# Patient Record
Sex: Female | Born: 1965 | Race: White | Hispanic: No | Marital: Single | Smoking: Current every day smoker
Health system: Southern US, Community
[De-identification: ages and names within clinical notes are randomized; demographics above are authoritative.]

## PROBLEM LIST (undated history)

## (undated) DIAGNOSIS — D649 Anemia, unspecified: Secondary | ICD-10-CM

## (undated) DIAGNOSIS — M5430 Sciatica, unspecified side: Secondary | ICD-10-CM

## (undated) DIAGNOSIS — J449 Chronic obstructive pulmonary disease, unspecified: Secondary | ICD-10-CM

## (undated) DIAGNOSIS — K219 Gastro-esophageal reflux disease without esophagitis: Secondary | ICD-10-CM

## (undated) DIAGNOSIS — R091 Pleurisy: Secondary | ICD-10-CM

## (undated) DIAGNOSIS — I10 Essential (primary) hypertension: Secondary | ICD-10-CM

## (undated) HISTORY — PX: ABLATION: SHX5711

## (undated) HISTORY — PX: OTHER SURGICAL HISTORY: SHX169

## (undated) HISTORY — PX: TUBAL LIGATION: SHX77

## (undated) SURGERY — COLONOSCOPY
Anesthesia: Moderate Sedation

---

## 2006-05-27 ENCOUNTER — Ambulatory Visit (HOSPITAL_COMMUNITY): Admission: RE | Admit: 2006-05-27 | Discharge: 2006-05-27 | Payer: Self-pay | Admitting: General Surgery

## 2006-05-27 ENCOUNTER — Encounter (INDEPENDENT_AMBULATORY_CARE_PROVIDER_SITE_OTHER): Payer: Self-pay | Admitting: Specialist

## 2008-02-13 ENCOUNTER — Emergency Department (HOSPITAL_COMMUNITY): Admission: EM | Admit: 2008-02-13 | Discharge: 2008-02-13 | Payer: Self-pay | Admitting: Emergency Medicine

## 2008-03-15 ENCOUNTER — Emergency Department (HOSPITAL_COMMUNITY): Admission: EM | Admit: 2008-03-15 | Discharge: 2008-03-15 | Payer: Self-pay | Admitting: Emergency Medicine

## 2008-07-30 ENCOUNTER — Emergency Department (HOSPITAL_COMMUNITY): Admission: EM | Admit: 2008-07-30 | Discharge: 2008-07-30 | Payer: Self-pay | Admitting: Emergency Medicine

## 2009-01-20 ENCOUNTER — Emergency Department (HOSPITAL_COMMUNITY): Admission: EM | Admit: 2009-01-20 | Discharge: 2009-01-20 | Payer: Self-pay | Admitting: Emergency Medicine

## 2010-04-29 LAB — DIFFERENTIAL
Basophils Absolute: 0.1 K/uL (ref 0.0–0.1)
Basophils Relative: 1 % (ref 0–1)
Eosinophils Absolute: 0.3 K/uL (ref 0.0–0.7)
Eosinophils Relative: 5 % (ref 0–5)
Lymphocytes Relative: 47 % — ABNORMAL HIGH (ref 12–46)
Lymphs Abs: 3.2 K/uL (ref 0.7–4.0)
Monocytes Absolute: 0.3 K/uL (ref 0.1–1.0)
Monocytes Relative: 5 % (ref 3–12)
Neutro Abs: 2.9 K/uL (ref 1.7–7.7)
Neutrophils Relative %: 42 % — ABNORMAL LOW (ref 43–77)

## 2010-04-29 LAB — CBC
HCT: 24.8 % — ABNORMAL LOW (ref 36.0–46.0)
Hemoglobin: 7.6 g/dL — ABNORMAL LOW (ref 12.0–15.0)
MCHC: 30.4 g/dL (ref 30.0–36.0)
MCV: 58.5 fL — ABNORMAL LOW (ref 78.0–100.0)
RBC: 4.25 MIL/uL (ref 3.87–5.11)
WBC: 6.8 10*3/uL (ref 4.0–10.5)

## 2010-04-29 LAB — URINALYSIS, ROUTINE W REFLEX MICROSCOPIC
Bilirubin Urine: NEGATIVE
Glucose, UA: NEGATIVE mg/dL
Hgb urine dipstick: NEGATIVE
Ketones, ur: NEGATIVE mg/dL
Nitrite: NEGATIVE
Protein, ur: NEGATIVE mg/dL
Specific Gravity, Urine: <1.005 — ABNORMAL LOW (ref 1.005–1.030)
Urobilinogen, UA: 0.2 mg/dL (ref 0.0–1.0)
pH: 6.5 (ref 5.0–8.0)

## 2010-04-29 LAB — BASIC METABOLIC PANEL
CO2: 25 mEq/L (ref 19–32)
Chloride: 108 mEq/L (ref 96–112)
Glucose, Bld: 108 mg/dL — ABNORMAL HIGH (ref 70–99)
Potassium: 3.1 mEq/L — ABNORMAL LOW (ref 3.5–5.1)
Sodium: 140 mEq/L (ref 135–145)

## 2010-04-29 LAB — POCT CARDIAC MARKERS
CKMB, poc: <1 ng/mL — ABNORMAL LOW (ref 1.0–8.0)
Myoglobin, poc: 45.9 ng/mL (ref 12–200)
Troponin i, poc: <0.05 ng/mL (ref 0.00–0.09)

## 2010-04-29 LAB — PREGNANCY, URINE

## 2010-04-29 LAB — D-DIMER, QUANTITATIVE

## 2010-06-14 NOTE — H&P (Signed)
NAMEKEVIN, [name]             ACCOUNT NO.:  [id number]   MEDICAL RECORD NO.:  [id number]          PATIENT TYPE:  AMB   LOCATION:                                FACILITY:  [facility]   PHYSICIAN:  Dalia Heading, M.D.  DATE OF BIRTH:  Nov 25, 1965   DATE OF ADMISSION:  DATE OF DISCHARGE:  LH                              HISTORY & PHYSICAL   CHIEF COMPLAINT:  Mass, face.   HISTORY OF PRESENT ILLNESS:  Patient is a 45 year old white female who  was referred for evaluation and treatment of a enlarging mass on her  face. It has been present for many years. No drainage has been noted.   PAST MEDICAL HISTORY:  Unremarkable.   PAST SURGICAL HISTORY:  Unremarkable.   CURRENT MEDICATIONS:  None.   ALLERGIES:  NO KNOWN DRUG ALLERGIES.   REVIEW OF SYSTEMS:  Patient denies any cardiopulmonary difficulties.   SOCIAL HISTORY:  She smokes a pack of cigarettes a day. She denies any  alcohol use.   PHYSICAL EXAMINATION:  GENERAL:  The patient is a well-developed, well-  nourished white female in no acute distress.  HEENT:  Examination reveals a 3 centimeter subcutaneous oval mass  present over the right cheek superiorly. It is mobile.  LUNGS:  Clear to auscultation with equal breath sounds bilaterally.  HEART:  Reveals a regular rate and rhythm without S3, S4 murmurs.   IMPRESSION:  Neoplasm, face.   PLAN:  The patient is scheduled for excision of the neoplasm, face on  05/27/2006. The risks and benefits of the procedure including bleeding,  infection, the possibility of scar revision in the future was fully  explained to the patient, gave informed consent. Short stay day  __________      Dalia Heading, M.D.  Electronically Signed     MAJ/MEDQ  D:  05/19/2006  T:  05/19/2006  Job:  [id number]   cc:   Dalia Heading, M.D.  Fax: [phone]

## 2010-06-14 NOTE — Op Note (Signed)
[name], [name]             ACCOUNT NO.:  [id number]   MEDICAL RECORD NO.:  [id number]          PATIENT TYPE:  AMB   LOCATION:  DAY                           FACILITY:  [facility]   PHYSICIAN:  Dalia Heading, M.D.  DATE OF BIRTH:  May 22, 1965   DATE OF PROCEDURE:  05/27/2006  DATE OF DISCHARGE:                               OPERATIVE REPORT   PREOPERATIVE DIAGNOSIS:  Mass, face.   POSTOPERATIVE DIAGNOSIS:  Sebaceous cyst, face.   PROCEDURE:  Excision of sebaceous cyst, face.   SURGEON:  Franky Macho, M.D.   ANESTHESIA:  General.   INDICATIONS:  The patient is a 45 year old white female who has a  subcutaneous mass which has been enlarging over the right cheek.  It has  been present for many years and the patient would like to have it  removed.  The risks and benefits of the procedure including bleeding,  infection, nerve injury, and the possibly of needing scar revision in  the future were fully explained to the patient, gave informed consent.   PROCEDURE NOTE:  The patient was placed in the supine position.  After  general anesthesia was administered, the right side of the face was  prepped and draped using the usual sterile technique with Betadine.  Surgical site confirmation was performed.   An orange-peel incision was made over the mass which measured  approximately 3-4 cm in its greatest diameter.  The mass was removed  without difficulty.  It is appeared to be a sebaceous cyst.  Excess skin  was then trimmed in order to facilitate closure due to significant  stretching of the skin from the mass.  The subcutaneous layer was  reapproximated using 5-0 Vicryl interrupted sutures.  The skin was  closed using a 5-0 Vicryl subcuticular suture.  Dermabond was then  applied.   All tape and needle counts were correct at the end of the procedure.  The patient was awakened and transferred to PACU in stable condition.   COMPLICATIONS:  None.   SPECIMEN:  Sebaceous cyst,  face.   BLOOD LOSS:  Minimal.      Dalia Heading, M.D.  Electronically Signed     MAJ/MEDQ  D:  05/27/2006  T:  05/27/2006  Job:  [id number]

## 2010-11-06 ENCOUNTER — Inpatient Hospital Stay (HOSPITAL_COMMUNITY)
Admission: EM | Admit: 2010-11-06 | Discharge: 2010-11-08 | DRG: 812 | Discharge status: Against Medical Advice | Payer: Self-pay | Attending: Internal Medicine | Admitting: Internal Medicine

## 2010-11-06 ENCOUNTER — Emergency Department (HOSPITAL_COMMUNITY): Payer: Self-pay

## 2010-11-06 ENCOUNTER — Encounter: Payer: Self-pay | Admitting: *Deleted

## 2010-11-06 DIAGNOSIS — D509 Iron deficiency anemia, unspecified: Principal | ICD-10-CM | POA: Diagnosis present

## 2010-11-06 DIAGNOSIS — N920 Excessive and frequent menstruation with regular cycle: Secondary | ICD-10-CM | POA: Diagnosis present

## 2010-11-06 DIAGNOSIS — E876 Hypokalemia: Secondary | ICD-10-CM | POA: Diagnosis present

## 2010-11-06 DIAGNOSIS — N939 Abnormal uterine and vaginal bleeding, unspecified: Secondary | ICD-10-CM | POA: Diagnosis present

## 2010-11-06 DIAGNOSIS — E611 Iron deficiency: Secondary | ICD-10-CM | POA: Diagnosis present

## 2010-11-06 DIAGNOSIS — E538 Deficiency of other specified B group vitamins: Secondary | ICD-10-CM | POA: Diagnosis not present

## 2010-11-06 DIAGNOSIS — R109 Unspecified abdominal pain: Secondary | ICD-10-CM

## 2010-11-06 DIAGNOSIS — Z720 Tobacco use: Secondary | ICD-10-CM | POA: Diagnosis present

## 2010-11-06 DIAGNOSIS — F17200 Nicotine dependence, unspecified, uncomplicated: Secondary | ICD-10-CM | POA: Diagnosis present

## 2010-11-06 DIAGNOSIS — I10 Essential (primary) hypertension: Secondary | ICD-10-CM | POA: Diagnosis present

## 2010-11-06 DIAGNOSIS — Z800 Family history of malignant neoplasm of digestive organs: Secondary | ICD-10-CM

## 2010-11-06 DIAGNOSIS — D529 Folate deficiency anemia, unspecified: Secondary | ICD-10-CM | POA: Diagnosis present

## 2010-11-06 DIAGNOSIS — D649 Anemia, unspecified: Secondary | ICD-10-CM

## 2010-11-06 DIAGNOSIS — K449 Diaphragmatic hernia without obstruction or gangrene: Secondary | ICD-10-CM | POA: Diagnosis present

## 2010-11-06 DIAGNOSIS — K921 Melena: Secondary | ICD-10-CM | POA: Diagnosis present

## 2010-11-06 HISTORY — DX: Chronic obstructive pulmonary disease, unspecified: J44.9

## 2010-11-06 LAB — COMPREHENSIVE METABOLIC PANEL
ALT: 6 U/L (ref 0–35)
AST: 12 U/L (ref 0–37)
Albumin: 3.5 g/dL (ref 3.5–5.2)
Alkaline Phosphatase: 52 U/L (ref 39–117)
Calcium: 8.6 mg/dL (ref 8.4–10.5)
GFR calc Af Amer: >90 mL/min (ref >90)
Potassium: 3.3 mEq/L — ABNORMAL LOW (ref 3.5–5.1)
Sodium: 138 mEq/L (ref 135–145)
Total Protein: 6.6 g/dL (ref 6.0–8.3)

## 2010-11-06 LAB — DIFFERENTIAL
Basophils Absolute: 0.1 10*3/uL (ref 0.0–0.1)
Eosinophils Absolute: 0.1 10*3/uL (ref 0.0–0.7)
Lymphs Abs: 2.8 10*3/uL (ref 0.7–4.0)
Monocytes Absolute: 0.5 10*3/uL (ref 0.1–1.0)
Neutrophils Relative %: 47 % (ref 43–77)

## 2010-11-06 LAB — CBC
MCH: 15.1 pg — ABNORMAL LOW (ref 26.0–34.0)
MCV: 56.5 fL — ABNORMAL LOW (ref 78.0–100.0)
Platelets: 178 10*3/uL (ref 150–400)
RBC: 3.52 MIL/uL — ABNORMAL LOW (ref 3.87–5.11)
RDW: 20.2 % — ABNORMAL HIGH (ref 11.5–15.5)

## 2010-11-06 LAB — URINALYSIS, ROUTINE W REFLEX MICROSCOPIC
Glucose, UA: NEGATIVE mg/dL
Specific Gravity, Urine: <1.005 — ABNORMAL LOW (ref 1.005–1.030)
Urobilinogen, UA: 0.2 mg/dL (ref 0.0–1.0)
pH: 5.5 (ref 5.0–8.0)

## 2010-11-06 LAB — URINE MICROSCOPIC-ADD ON

## 2010-11-06 LAB — OCCULT BLOOD, POC DEVICE: Fecal Occult Bld: NEGATIVE

## 2010-11-06 LAB — PREGNANCY, URINE: Preg Test, Ur: NEGATIVE

## 2010-11-06 MED ORDER — IOHEXOL 300 MG/ML  SOLN
100.0000 mL | Freq: Once | INTRAMUSCULAR | Status: AC | PRN
  Administered 2010-11-06: 100 mL via INTRAVENOUS

## 2010-11-06 MED ORDER — IPRATROPIUM BROMIDE 0.02 % IN SOLN: 0.5000 mg | Freq: Four times a day (QID) | RESPIRATORY_TRACT | Status: DC | PRN

## 2010-11-06 MED ORDER — PANTOPRAZOLE SODIUM 40 MG PO TBEC
40.0000 mg | DELAYED_RELEASE_TABLET | Freq: Two times a day (BID) | ORAL | Status: DC
  Administered 2010-11-07: 40 mg via ORAL
  Filled 2010-11-06: qty 1

## 2010-11-06 MED ORDER — NICOTINE 14 MG/24HR TD PT24
14.0000 mg | MEDICATED_PATCH | Freq: Every day | TRANSDERMAL | Status: DC
  Administered 2010-11-07: 14 mg via TRANSDERMAL
  Filled 2010-11-06: qty 1

## 2010-11-06 MED ORDER — SODIUM CHLORIDE 0.9 % IJ SOLN: 3.0000 mL | INTRAMUSCULAR | Status: DC | PRN

## 2010-11-06 MED ORDER — ACETAMINOPHEN 650 MG RE SUPP: 650.0000 mg | Freq: Four times a day (QID) | RECTAL | Status: DC | PRN

## 2010-11-06 MED ORDER — SODIUM CHLORIDE 0.9 % IV SOLN: 250.0000 mL | INTRAVENOUS | Status: DC

## 2010-11-06 MED ORDER — ONDANSETRON HCL 4 MG/2ML IJ SOLN
4.0000 mg | Freq: Once | INTRAMUSCULAR | Status: AC
  Administered 2010-11-06: 4 mg via INTRAVENOUS
  Filled 2010-11-06: qty 2

## 2010-11-06 MED ORDER — MORPHINE SULFATE 2 MG/ML IJ SOLN: 2.0000 mg | INTRAMUSCULAR | Status: DC | PRN

## 2010-11-06 MED ORDER — ALBUTEROL SULFATE (5 MG/ML) 0.5% IN NEBU: 2.5000 mg | INHALATION_SOLUTION | Freq: Four times a day (QID) | RESPIRATORY_TRACT | Status: DC | PRN

## 2010-11-06 MED ORDER — ACETAMINOPHEN 325 MG PO TABS
650.0000 mg | ORAL_TABLET | Freq: Once | ORAL | Status: DC
  Filled 2010-11-06: qty 2

## 2010-11-06 MED ORDER — DIPHENHYDRAMINE HCL 25 MG PO CAPS
25.0000 mg | ORAL_CAPSULE | Freq: Once | ORAL | Status: AC
  Administered 2010-11-07: 25 mg via ORAL
  Filled 2010-11-06: qty 1

## 2010-11-06 MED ORDER — SODIUM CHLORIDE 0.9 % IJ SOLN: 3.0000 mL | Freq: Two times a day (BID) | INTRAMUSCULAR | Status: DC

## 2010-11-06 MED ORDER — HYDROCODONE-ACETAMINOPHEN 5-325 MG PO TABS: 1.0000 | ORAL_TABLET | ORAL | Status: DC | PRN

## 2010-11-06 MED ORDER — PANTOPRAZOLE SODIUM 40 MG IV SOLR
INTRAVENOUS | Status: AC
  Filled 2010-11-06: qty 80

## 2010-11-06 MED ORDER — SODIUM CHLORIDE 0.9 % IV BOLUS (SEPSIS): 1000.0000 mL | Freq: Once | INTRAVENOUS | Status: DC

## 2010-11-06 MED ORDER — SODIUM CHLORIDE 0.9 % IV SOLN
80.0000 mg | Freq: Once | INTRAVENOUS | Status: AC
  Administered 2010-11-06: 80 mg via INTRAVENOUS
  Filled 2010-11-06: qty 80

## 2010-11-06 MED ORDER — ACETAMINOPHEN 500 MG PO TABS
500.0000 mg | ORAL_TABLET | Freq: Four times a day (QID) | ORAL | Status: DC | PRN
  Administered 2010-11-07: 500 mg via ORAL
  Filled 2010-11-06: qty 1

## 2010-11-06 MED ORDER — HYDROMORPHONE HCL 1 MG/ML IJ SOLN
1.0000 mg | Freq: Once | INTRAMUSCULAR | Status: AC
  Administered 2010-11-06: 1 mg via INTRAVENOUS
  Filled 2010-11-06: qty 1

## 2010-11-06 NOTE — H&P (Signed)
PCP:   No primary provider on file.  Chief Complaint:  Abdominal pain  HPI: This is a 44 y/o female with no significant past medical history, who today developed severe right sided abdominal pain, which woke here from sleep. She states it was sharp and intermittent. Pain continued to worsen during the course of the day. She finally present to the ER. She reports no nausea, no vomiting, no diarrhea. No blood in her urine, no burning on urination. No fevers, no chills.   In the ER blood work revealed a hemoglobin of 5. The patient states she has been having dark stools on and off for approximately 6 months. She does not report using spicy food, no NSAIDs. No h/o PUD. No significant alcohol use. She has never had a colonoscopy. She does have a pretty significant family history of colon cancer. He deceased father had colon cancer in his 63's, he brother aged 72 was recently diagnosed with colon cancer. She reports good appetite, no weight loss. She does report menorrhagia.  Review of Systems: (positives bolded) The patient denies anorexia, fever, weight loss,, vision loss, decreased hearing, hoarseness, chest pain, syncope, dyspnea on exertion, peripheral edema, balance deficits, hemoptysis, abdominal pain, melena, hematochezia, severe indigestion/heartburn, hematuria, incontinence, genital sores, muscle weakness, suspicious skin lesions, transient blindness, difficulty walking, depression, unusual weight change, abnormal bleeding, enlarged lymph nodes, angioedema, and breast masses.  Past Medical History: History reviewed. No pertinent past medical history. History reviewed. No pertinent past surgical history.  Medications: Prior to Admission medications   Medication Sig Start Date End Date Taking? Authorizing Provider  aspirin-acetaminophen-caffeine (HEADACHE RELIEF) 250-250-65 MG per tablet Take 2 tablets by mouth as needed. For headaches    Yes Historical Provider, MD    Oreg-Peppermint-Thyme-Gldnseal (YEAST FORMULA PO) Take 1 tablet by mouth 3 (three) times daily as needed. For yeast infection    Yes Historical Provider, MD    Allergies:  No Known Allergies  Social History:  reports that she has been smoking Cigarettes.  She has been smoking about 1 pack per day. She does not have any smokeless tobacco history on file. She reports that she does not drink alcohol or use illicit drugs.  Family History:  colon cancer  Physical Exam: Filed Vitals:   11/06/10 1805 11/06/10 2021 11/06/10 2151  BP: 153/102 134/86 122/72  Pulse: 102 80 61  Temp: 98.8 F (37.1 C)    TempSrc: Oral    Resp: 20 20 18   Height: 5\' 3"  (1.6 m)    Weight: 63.504 kg (140 lb)    SpO2: 100% 100% 97%   General:  Alert and oriented times three, well developed and nourished, no acute distress Eyes: PERRLA, pink conjunctiva, scleral icterus ENT: Moist oral mucosa, neck supple, no thyromegaly Lungs: clear to ascultation, no wheeze, no crackles, no use of accessory muscles Cardiovascular: regular rate and rhythm, no regurgitation, no gallops, no murmurs. No carotid bruits, no JVD Abdomen: soft, positive BS, non-tender, non distended, no organomegaly, not an acute abdomen GU: not examined Neuro: CN II - XII grossly intact, sensation intact Musculoskeletal: strength 5/5 all extremities, no clubbing, cyanosis or edema Skin: no rash, no subcutaneous crepitation, no decubitus Psych: appropriate patient   Labs on Admission:   [facility] 11/06/10 1925  NA 138  K 3.3*  CL 106  CO2 24  GLUCOSE 100*  BUN 6  CREATININE 0.57  CALCIUM 8.6  MG --  PHOS --    Basename 11/06/10 1925  AST 12  ALT 6  ALKPHOS 52  BILITOT 0.2*  PROT 6.6  ALBUMIN 3.5    Basename 11/06/10 1925  LIPASE 28  AMYLASE --    Basename 11/06/10 1925  WBC 6.5  NEUTROABS 3.0  HGB 5.3*  HCT 19.9*  MCV 56.5*  PLT 178   No results found for this basename: CKTOTAL:3,CKMB:3,CKMBINDEX:3,TROPONINI:3 in  the last 72 hours No results found for this basename: TSH,T4TOTAL,FREET3,T3FREE,THYROIDAB in the last 72 hours No results found for this basename: VITAMINB12:2,FOLATE:2,FERRITIN:2,TIBC:2,IRON:2,RETICCTPCT:2 in the last 72 hours  Radiological Exams on Admission: Ct Abdomen Pelvis W Contrast  11/06/2010  *RADIOLOGY REPORT*  Clinical Data: Abdominal pain right lower quadrant  CT ABDOMEN AND PELVIS WITH CONTRAST  Technique:  Multidetector CT imaging of the abdomen and pelvis was performed following the standard protocol during bolus administration of intravenous contrast.  Contrast: OMNIPAQUE IOHEXOL 300 MG/ML IV SOLN  Comparison: None.  Findings: Lung bases are clear.  No pleural or pericardial fluid. The liver has a normal appearance.  No calcified gallstones.  The spleen is normal.  The pancreas is normal.  The adrenal glands are normal.  The kidneys are normal.  The aorta is normal except for very minimal wall calcification.  No aneurysm.  The IVC is normal. No retroperitoneal mass or adenopathy.  No free intraperitoneal fluid or air.  The appendix is well seen as a normal structure. The uterus is normal.  The adnexal regions are normal.  No abnormality of the bladder.  No other bowel pathology.  Mild degenerative changes effect the lower lumbar spine.  IMPRESSION: No acute or significant finding.  Normal appearance of the appendix.  No cause of pain identified.  Original Report Authenticated By: Thomasenia Sales, M.D.    Assessment/Plan Present on Admission:  .Anemia, unspecified -significant FHx of colon cancer -CEA oredered -anemia panel ordered -no serial h/h ordered as patient not actively bleeding -will transfuse 2 units PRBC -protonix PO BiD -add INR -clear liquid diet -GI consult for colonoscopy in AM Tobacco use -nicotine patch ordered   DVT prophylaxis: SCD's Code status: full code Team 2 Zoltan Genest 11/06/2010, 10:34 PM

## 2010-11-06 NOTE — ED Provider Notes (Addendum)
History     CSN: [id number] Arrival date & time: 11/06/2010  6:09 PM  Chief Complaint  Patient presents with  . Abdominal Pain    RLQ    (Consider location/radiation/quality/duration/timing/severity/associated sxs/prior treatment) Patient is a 45 y.o. female presenting with abdominal pain. The history is provided by the patient (Patient states onset of right lower quadrant pain suddenly this morning while lying in bed. She states she has had some nausea but no vomiting. She did eat breakfast and lunch around 3:00. She states her appetite is not as much now. She has had some freque).  Abdominal Pain The primary symptoms of the illness include abdominal pain. The current episode started 6 to 12 hours ago. The onset of the illness was sudden. The problem has not changed since onset. Additional symptoms associated with the illness include frequency. Symptoms associated with the illness do not include anorexia, diaphoresis, constipation, hematuria or back pain.    History reviewed. No pertinent past medical history.  History reviewed. No pertinent past surgical history.  No family history on file.  History  Substance Use Topics  . Smoking status: Current Everyday Smoker -- 1.0 packs/day    Types: Cigarettes  . Smokeless tobacco: Not on file  . Alcohol Use: No    OB History    Grav Para Term Preterm Abortions TAB SAB Ect Mult Living                  Review of Systems  Constitutional: Negative for diaphoresis.  Gastrointestinal: Positive for abdominal pain. Negative for constipation and anorexia.  Genitourinary: Positive for frequency. Negative for hematuria.  Musculoskeletal: Negative for back pain.  All other systems reviewed and are negative.    Allergies  Review of patient's allergies indicates no known allergies.  Home Medications  No current outpatient prescriptions on file.  BP 153/102  Pulse 102  Temp(Src) 98.8 F (37.1 C) (Oral)  Resp 20  Ht 5\' 3"  (1.6 m)   Wt 140 lb (63.504 kg)  BMI 24.80 kg/m2  SpO2 100%  LMP 11/02/2010  Physical Exam  Nursing note and vitals reviewed. Constitutional: She is oriented to person, place, and time. She appears well-developed and well-nourished.  HENT:  Head: Normocephalic.  Eyes: Conjunctivae and EOM are normal. Pupils are equal, round, and reactive to light.  Neck: Normal range of motion. Neck supple.  Cardiovascular: Normal rate and regular rhythm.   Pulmonary/Chest: Effort normal.  Abdominal: Bowel sounds are normal. She exhibits no distension and no mass. There is tenderness. There is no rebound and no guarding.       Mild tenderness right lower quadrant without rebound.  Genitourinary: Rectal exam shows no mass and no tenderness. Guaiac negative stool.  Musculoskeletal: Normal range of motion.  Neurological: She is alert and oriented to person, place, and time. She has normal reflexes.  Skin: Skin is warm and dry.  Psychiatric: She has a normal mood and affect.    ED Course  Procedures (including critical care time)  Labs Reviewed - No data to display No results found.   No diagnosis found. Results for orders placed during the hospital encounter of 11/06/10  CBC      Component Value Range   WBC 6.5  4.0 - 10.5 (K/uL)   RBC 3.52 (*) 3.87 - 5.11 (MIL/uL)   Hemoglobin 5.3 (*) 12.0 - 15.0 (g/dL)   HCT 56.2 (*) 13.0 - 46.0 (%)   MCV 56.5 (*) 78.0 - 100.0 (fL)   MCH 15.1 (*)  26.0 - 34.0 (pg)   MCHC 26.6 (*) 30.0 - 36.0 (g/dL)   RDW 04.5 (*) 40.9 - 15.5 (%)   Platelets 178  150 - 400 (K/uL)  DIFFERENTIAL      Component Value Range   Neutrophils Relative 47  43 - 77 (%)   Lymphocytes Relative 43  12 - 46 (%)   Monocytes Relative 8  3 - 12 (%)   Eosinophils Relative 1  0 - 5 (%)   Basophils Relative 1  0 - 1 (%)   Neutro Abs 3.0  1.7 - 7.7 (K/uL)   Lymphs Abs 2.8  0.7 - 4.0 (K/uL)   Monocytes Absolute 0.5  0.1 - 1.0 (K/uL)   Eosinophils Absolute 0.1  0.0 - 0.7 (K/uL)   Basophils  Absolute 0.1  0.0 - 0.1 (K/uL)  COMPREHENSIVE METABOLIC PANEL      Component Value Range   Sodium 138  135 - 145 (mEq/L)   Potassium 3.3 (*) 3.5 - 5.1 (mEq/L)   Chloride 106  96 - 112 (mEq/L)   CO2 24  19 - 32 (mEq/L)   Glucose, Bld 100 (*) 70 - 99 (mg/dL)   BUN 6  6 - 23 (mg/dL)   Creatinine, Ser 8.11  0.50 - 1.10 (mg/dL)   Calcium 8.6  8.4 - 91.4 (mg/dL)   Total Protein 6.6  6.0 - 8.3 (g/dL)   Albumin 3.5  3.5 - 5.2 (g/dL)   AST 12  0 - 37 (U/L)   ALT 6  0 - 35 (U/L)   Alkaline Phosphatase 52  39 - 117 (U/L)   Total Bilirubin 0.2 (*) 0.3 - 1.2 (mg/dL)   GFR calc non Af Amer >90  >90 (mL/min)   GFR calc Af Amer >90  >90 (mL/min)  LIPASE, BLOOD      Component Value Range   Lipase 28  11 - 59 (U/L)  URINALYSIS, ROUTINE W REFLEX MICROSCOPIC      Component Value Range   Color, Urine YELLOW  YELLOW    Appearance CLEAR  CLEAR    Specific Gravity, Urine <1.005 (*) 1.005 - 1.030    pH 5.5  5.0 - 8.0    Glucose, UA NEGATIVE  NEGATIVE (mg/dL)   Hgb urine dipstick TRACE (*) NEGATIVE    Bilirubin Urine NEGATIVE  NEGATIVE    Ketones, ur NEGATIVE  NEGATIVE (mg/dL)   Protein, ur NEGATIVE  NEGATIVE (mg/dL)   Urobilinogen, UA 0.2  0.0 - 1.0 (mg/dL)   Nitrite NEGATIVE  NEGATIVE    Leukocytes, UA NEGATIVE  NEGATIVE   PREGNANCY, URINE      Component Value Range   Preg Test, Ur NEGATIVE    URINE MICROSCOPIC-ADD ON      Component Value Range   Squamous Epithelial / LPF RARE  RARE    RBC / HPF 0-2  <3 (RBC/hpf)   Bacteria, UA RARE  RARE   OCCULT BLOOD, POC DEVICE      Component Value Range   Fecal Occult Bld NEGATIVE         MDM  Patient without obvious source of her right flank pain on CAT scan. She does have 0-2 red blood cells on her urinalysis. She also is noted to have a hemoglobin of 5.3. She states she has had some rectal bleeding in the past and thought it was an ulcer. She has not noted any recent rectal bleeding or dark tarry stools. She has not had any chest pain, shortness  of breath,  or lightheadedness. Plan to admit patient for further workup of her anemia and possible transfusion. I have not ordered type and cross here in the emergency department as she is currently asymptomatic.     Hilario Quarry, MD 11/06/10 2125  Patient care discussed with Dr. Joneen Roach and she will be in to see and evaluate. She will her iron studies and type and screen and/or cross as needed.  Hilario Quarry, MD 11/06/10 2137

## 2010-11-06 NOTE — ED Notes (Signed)
Report called to 3a.

## 2010-11-06 NOTE — ED Notes (Signed)
C/o RLQ pain onset 0300 today; c/o nausea; denies v/d

## 2010-11-07 ENCOUNTER — Encounter (HOSPITAL_COMMUNITY): Payer: Self-pay

## 2010-11-07 ENCOUNTER — Other Ambulatory Visit: Payer: Self-pay | Admitting: Internal Medicine

## 2010-11-07 ENCOUNTER — Encounter (HOSPITAL_COMMUNITY)
Admission: EM | Discharge status: Against Medical Advice | Payer: Self-pay | Source: Home / Self Care | Attending: Internal Medicine

## 2010-11-07 DIAGNOSIS — E876 Hypokalemia: Secondary | ICD-10-CM | POA: Diagnosis present

## 2010-11-07 DIAGNOSIS — K921 Melena: Secondary | ICD-10-CM

## 2010-11-07 DIAGNOSIS — K219 Gastro-esophageal reflux disease without esophagitis: Secondary | ICD-10-CM

## 2010-11-07 DIAGNOSIS — K449 Diaphragmatic hernia without obstruction or gangrene: Secondary | ICD-10-CM

## 2010-11-07 DIAGNOSIS — Z800 Family history of malignant neoplasm of digestive organs: Secondary | ICD-10-CM

## 2010-11-07 DIAGNOSIS — D509 Iron deficiency anemia, unspecified: Secondary | ICD-10-CM

## 2010-11-07 DIAGNOSIS — D649 Anemia, unspecified: Secondary | ICD-10-CM

## 2010-11-07 HISTORY — PX: ESOPHAGOGASTRODUODENOSCOPY: SHX5428

## 2010-11-07 LAB — HEMOGLOBIN AND HEMATOCRIT, BLOOD
HCT: 26.5 % — ABNORMAL LOW (ref 36.0–46.0)
Hemoglobin: 5.4 g/dL — CL (ref 12.0–15.0)

## 2010-11-07 LAB — PREPARE RBC (CROSSMATCH)

## 2010-11-07 LAB — PROTIME-INR
INR: 1.05 (ref 0.00–1.49)
INR: 1.13 (ref 0.00–1.49)
Prothrombin Time: 14.7 seconds (ref 11.6–15.2)

## 2010-11-07 LAB — RETICULOCYTES: Retic Count, Absolute: 43 10*3/uL (ref 19.0–186.0)

## 2010-11-07 LAB — IRON AND TIBC
Iron: 10 ug/dL — ABNORMAL LOW (ref 42–135)
Saturation Ratios: 3 % — ABNORMAL LOW (ref 20–55)
TIBC: 398 ug/dL (ref 250–470)
UIBC: 388 ug/dL (ref 125–400)

## 2010-11-07 LAB — FERRITIN: Ferritin: 3 ng/mL — ABNORMAL LOW (ref 10–291)

## 2010-11-07 LAB — CBC
Hemoglobin: 5.5 g/dL — CL (ref 12.0–15.0)
MCHC: 27.9 g/dL — ABNORMAL LOW (ref 30.0–36.0)
RBC: 3.32 MIL/uL — ABNORMAL LOW (ref 3.87–5.11)

## 2010-11-07 LAB — BASIC METABOLIC PANEL
GFR calc Af Amer: >90 mL/min (ref >90)
GFR calc non Af Amer: >90 mL/min (ref >90)
Potassium: 3.3 mEq/L — ABNORMAL LOW (ref 3.5–5.1)
Sodium: 138 mEq/L (ref 135–145)

## 2010-11-07 SURGERY — EGD (ESOPHAGOGASTRODUODENOSCOPY)
Anesthesia: Moderate Sedation

## 2010-11-07 MED ORDER — MEPERIDINE HCL 100 MG/ML IJ SOLN
INTRAMUSCULAR | Status: DC | PRN
  Administered 2010-11-07: 50 mg via INTRAVENOUS
  Administered 2010-11-07: 25 mg via INTRAVENOUS
  Administered 2010-11-07: 50 mg via INTRAVENOUS

## 2010-11-07 MED ORDER — IPRATROPIUM BROMIDE 0.02 % IN SOLN: 0.5000 mg | Freq: Four times a day (QID) | RESPIRATORY_TRACT | Status: DC | PRN

## 2010-11-07 MED ORDER — POTASSIUM CHLORIDE CRYS ER 20 MEQ PO TBCR
20.0000 meq | EXTENDED_RELEASE_TABLET | Freq: Two times a day (BID) | ORAL | Status: DC
  Administered 2010-11-07: 20 meq via ORAL
  Filled 2010-11-07: qty 1

## 2010-11-07 MED ORDER — BISACODYL 5 MG PO TBEC: 10.0000 mg | DELAYED_RELEASE_TABLET | Freq: Once | ORAL | Status: DC

## 2010-11-07 MED ORDER — PEG 3350-KCL-NA BICARB-NACL 420 G PO SOLR
ORAL | Status: AC
  Filled 2010-11-07: qty 4000

## 2010-11-07 MED ORDER — PANTOPRAZOLE SODIUM 40 MG PO TBEC
40.0000 mg | DELAYED_RELEASE_TABLET | Freq: Two times a day (BID) | ORAL | Status: DC
  Administered 2010-11-08: 40 mg via ORAL
  Filled 2010-11-07: qty 1

## 2010-11-07 MED ORDER — DIPHENHYDRAMINE HCL 25 MG PO CAPS: 25.0000 mg | ORAL_CAPSULE | Freq: Once | ORAL | Status: DC

## 2010-11-07 MED ORDER — MEPERIDINE HCL 100 MG/ML IJ SOLN
INTRAMUSCULAR | Status: AC
  Filled 2010-11-07: qty 2

## 2010-11-07 MED ORDER — PEG 3350-KCL-NABCB-NACL-NASULF 236 G PO SOLR
2000.0000 mL | Freq: Once | ORAL | Status: DC
  Filled 2010-11-07: qty 4000

## 2010-11-07 MED ORDER — MIDAZOLAM HCL 5 MG/5ML IJ SOLN
INTRAMUSCULAR | Status: AC
  Filled 2010-11-07: qty 10

## 2010-11-07 MED ORDER — NICOTINE 14 MG/24HR TD PT24: 14.0000 mg | MEDICATED_PATCH | Freq: Every day | TRANSDERMAL | Status: DC

## 2010-11-07 MED ORDER — SODIUM CHLORIDE 0.9 % IJ SOLN
INTRAMUSCULAR | Status: AC
  Administered 2010-11-07: 10 mL
  Filled 2010-11-07: qty 10

## 2010-11-07 MED ORDER — ALBUTEROL SULFATE (5 MG/ML) 0.5% IN NEBU: 2.5000 mg | INHALATION_SOLUTION | Freq: Four times a day (QID) | RESPIRATORY_TRACT | Status: DC | PRN

## 2010-11-07 MED ORDER — MORPHINE SULFATE 2 MG/ML IJ SOLN: 2.0000 mg | INTRAMUSCULAR | Status: DC | PRN

## 2010-11-07 MED ORDER — BUTAMBEN-TETRACAINE-BENZOCAINE 2-2-14 % EX AERO
INHALATION_SPRAY | CUTANEOUS | Status: DC | PRN
  Administered 2010-11-07: 2 via TOPICAL

## 2010-11-07 MED ORDER — SODIUM CHLORIDE 0.9 % IJ SOLN
INTRAMUSCULAR | Status: AC
  Administered 2010-11-07: 11:00:00
  Filled 2010-11-07: qty 3

## 2010-11-07 MED ORDER — SODIUM CHLORIDE 0.45 % IV SOLN
Freq: Once | INTRAVENOUS | Status: AC
  Administered 2010-11-07: 18:00:00 via INTRAVENOUS

## 2010-11-07 MED ORDER — HYDROCODONE-ACETAMINOPHEN 5-325 MG PO TABS: 1.0000 | ORAL_TABLET | ORAL | Status: DC | PRN

## 2010-11-07 MED ORDER — SODIUM CHLORIDE 0.45 % IV SOLN: Freq: Once | INTRAVENOUS | Status: AC

## 2010-11-07 MED ORDER — ONDANSETRON HCL 4 MG/2ML IJ SOLN: 4.0000 mg | Freq: Once | INTRAMUSCULAR | Status: DC

## 2010-11-07 MED ORDER — PEG 3350-KCL-NABCB-NACL-NASULF 236 G PO SOLR
2000.0000 mL | Freq: Once | ORAL | Status: AC
  Administered 2010-11-08: 2000 mL via ORAL
  Filled 2010-11-07: qty 4000

## 2010-11-07 MED ORDER — MIDAZOLAM HCL 5 MG/5ML IJ SOLN
INTRAMUSCULAR | Status: DC | PRN
  Administered 2010-11-07: 1 mg via INTRAVENOUS
  Administered 2010-11-07 (×2): 2 mg via INTRAVENOUS
  Administered 2010-11-07: 1 mg via INTRAVENOUS

## 2010-11-07 MED ORDER — SODIUM CHLORIDE 0.9 % IJ SOLN
INTRAMUSCULAR | Status: AC
  Administered 2010-11-07: 22:00:00
  Filled 2010-11-07: qty 3

## 2010-11-07 MED ORDER — HYDROMORPHONE HCL 1 MG/ML IJ SOLN: 1.0000 mg | Freq: Once | INTRAMUSCULAR | Status: DC

## 2010-11-07 MED ORDER — SODIUM CHLORIDE 0.9 % IV SOLN
80.0000 mg | Freq: Once | INTRAVENOUS | Status: DC
  Filled 2010-11-07: qty 80

## 2010-11-07 MED ORDER — PEG 3350-KCL-NABCB-NACL-NASULF 236 G PO SOLR
2000.0000 mL | Freq: Once | ORAL | Status: AC
  Administered 2010-11-07: 2000 mL via ORAL
  Filled 2010-11-07: qty 4000

## 2010-11-07 MED ORDER — ACETAMINOPHEN 500 MG PO TABS: 500.0000 mg | ORAL_TABLET | Freq: Four times a day (QID) | ORAL | Status: DC | PRN

## 2010-11-07 MED ORDER — BISACODYL 5 MG PO TBEC
10.0000 mg | DELAYED_RELEASE_TABLET | Freq: Once | ORAL | Status: AC
  Administered 2010-11-07: 10 mg via ORAL
  Filled 2010-11-07: qty 1

## 2010-11-07 NOTE — Progress Notes (Signed)
CRITICAL VALUE ALERT  Critical value received:  Hgb 5.4   Date of notification:  11/07/10  Time of notification:  01:15   Critical value read back:yes  Nurse who received alert:  Rhae Hammock, RN  MD notified (1st page):  See note below  Time of first page:  See note below    MD notified (2nd page):  Time of second page:  Responding MD:  See note below  Time MD responded:  See note below  Dr. Joneen Roach is aware of this and a blood transfusion of two units has been ordered.  I am waiting on the lab to call when the blood is ready.  Consent already signed.  H&H will be done post-transfusion as ordered.

## 2010-11-07 NOTE — Consult Note (Signed)
Referring Provider: Dr. Jonny Ruiz  Primary Care Physician:  None Primary Gastroenterologist:  Roetta Sessions, MD   Reason for Consultation:  Anemia, melena  HPI: [name] is a 45 y.o. female admitted last night with profound asymptomatic anemia. Last saw a doctor one year ago for heavy menses. Irregular menses. Seen in [place], [place] by gyn. Bleeds every 16 days or so, four heavy days each time. Passes blood clots. Last cycle started Saturday, no bleeding currently. Sometimes lot of cramps. Told she needed surgery. Unable to have done secondary to no insurance.   Came to ED last night due to acute onset right sided abd pain which woke her up from sleep at 3am. Worried she had appendicitis. CT unremarkable. Found to have hgb of 5. Patient states she had blood work a year ago and was not told she was anemic. No constipation, diarrhea, brbpr. Some melena. Happens off/on over past several months. Sees black stools with spicey foods, chocolate. Associated with nausea. Lots of heartburn. Takes [employer] antacid daily, ?like prilosec. Trouble swallowing since had teeth pulled last 01/2009. Dentures don't fit. No weight loss.   Came to ED due to acute onset right sided abdominal pain. Pain intermittent today. Started yesterday at 3am. No pain at this time.    Heme negative on DRE in ED. No prior EGD/TCS.  Home medications: Tylenol as needed. She denies ASA or ASA powders, NSAIDS.   Current Facility-Administered Medications  Medication Dose Route Frequency Provider Last Rate Last Dose  . acetaminophen (TYLENOL) tablet 500 mg  500 mg Oral Q6H PRN Debby Crosley, MD   500 mg at 11/07/10 0456   Or  . acetaminophen (TYLENOL) suppository 650 mg  650 mg Rectal Q6H PRN Debby Crosley, MD      .      Gery Pray, MD      . albuterol (PROVENTIL) (5 MG/ML) 0.5% nebulizer solution 2.5 mg  2.5 mg Nebulization Q6H PRN Gery Pray, MD      .           . HYDROcodone-acetaminophen (NORCO) 5-325 MG per tablet  1 tablet  1 tablet Oral Q4H PRN Debby Crosley, MD                 . iohexol (OMNIPAQUE) 300 MG/ML injection 100 mL  100 mL Intravenous Once PRN Medication Radiologist   100 mL at 11/06/10 2020  . ipratropium (ATROVENT) nebulizer solution 0.5 mg  0.5 mg Nebulization Q6H PRN Debby Crosley, MD      . morphine 2 MG/ML injection 2 mg  2 mg Intravenous Q4H PRN Debby Crosley, MD      . nicotine (NICODERM CQ - dosed in mg/24 hours) patch 14 mg  14 mg Transdermal Daily Debby Crosley, MD      . ondansetron (ZOFRAN) injection 4 mg  4 mg Intravenous Once Hilario Quarry, MD   4 mg at 11/06/10 1922             . pantoprazole (PROTONIX) EC tablet 40 mg  40 mg Oral BID AC Debby Crosley, MD   40 mg at 11/07/10 0854  . potassium chloride SA (K-DUR,KLOR-CON) CR tablet 20 mEq  20 mEq Oral BID Jonny Ruiz                                             Allergies as  of 11/06/2010  . (No Known Allergies)    History reviewed. No pertinent past medical history.  Past Surgical History  Procedure Date  . Tubal ligation     Family History  Problem Relation Age of Onset  . Colon cancer Father 55  . Colon cancer Brother 78    History   Social History  . Marital Status: Single    Spouse Name: N/A    Number of Children: N/A  . Years of Education: N/A   Occupational History  . Not on file.   Social History Main Topics  . Smoking status: Current Everyday Smoker -- 1.0 packs/day    Types: Cigarettes  . Smokeless tobacco: Not on file  . Alcohol Use: No  . Drug Use: Yes    Special: Marijuana  . Sexually Active: Not Currently   Other Topics Concern  . Not on file   Social History Narrative  . No narrative on file     ROS:  General: Negative for anorexia, weight loss, fever, chills, fatigue, weakness. Eyes: Negative for vision changes.  ENT: Negative for hoarseness, difficulty swallowing , nasal congestion. CV: Negative for chest pain, angina, palpitations, peripheral edema.  Respiratory:  Negative for dyspnea at rest, cough, sputum, wheezing. Some chronic DOE which she blames on smoking. No h/o asthma/copd. GI: See history of present illness. GU:  Negative for dysuria, hematuria, urinary incontinence, urinary frequency, nocturnal urination.  MS: Negative for joint pain, low back pain.  Derm: Negative for rash or itching.  Neuro: Negative for weakness, abnormal sensation, seizure, frequent headaches, memory loss, confusion.  Psych: Negative for anxiety, depression, suicidal ideation, hallucinations.  Endo: Negative for unusual weight change.  Heme: Negative for bruising or bleeding. Allergy: Negative for rash or hives.       Physical Examination: Vital signs in last 24 hours: Temp:  [98.1 F (36.7 C)-98.8 F (37.1 C)] 98.3 F (36.8 C) (10/11 0545) Pulse Rate:  [61-102] 64  (10/11 0545) Resp:  [18-20] 18  (10/11 0545) BP: (111-153)/(65-102) 111/65 mmHg (10/11 0545) SpO2:  [97 %-100 %] 98 % (10/11 0018) Weight:  [140 lb (63.504 kg)-140 lb 0.1 oz (63.507 kg)] 140 lb 0.1 oz (63.507 kg) (10/11 0018) Last BM Date: 11/06/10  General: Well-nourished, well-developed in no acute distress.  Head: Normocephalic, atraumatic.   Eyes: Conjunctiva pale, no icterus. Mouth: Oropharyngeal mucosa moist and pink , no lesions erythema or exudate. Neck: Supple without thyromegaly, masses, or lymphadenopathy.  Lungs: Clear to auscultation bilaterally.  Heart: Regular rate and rhythm, no murmurs rubs or gallops.  Abdomen: Bowel sounds are normal, nontender, nondistended, no hepatosplenomegaly or masses, no abdominal bruits or    hernia , no rebound or guarding.   Rectal: Heme negative in ED on DRE. Extremities: No lower extremity edema, clubbing, deformity.  Neuro: Alert and oriented x 4 , grossly normal neurologically.  Skin: Warm and dry, no rash or jaundice.  Pale. Psych: Alert and cooperative, normal mood and affect.        Intake/Output from previous day: 10/10 0701 - 10/11  0700 In: 250 [I.V.:250] Out: -  Intake/Output this shift:    Lab Results: CBC  Basename 11/07/10 0617 11/06/10 2332 11/06/10 1925  WBC 4.7 -- 6.5  HGB 5.5* 5.4* 5.3*  HCT 19.7* 20.2* 19.9*  MCV 59.3* -- 56.5*  PLT 124* -- 178   BMET  Basename 11/07/10 0617 11/06/10 1925  NA 138 138  K 3.3* 3.3*  CL 105 106  CO2 24 24  GLUCOSE 95 100*  BUN 4* 6  CREATININE 0.59 0.57  CALCIUM 8.4 8.6   LFT  Basename 11/06/10 1925  BILITOT 0.2*  BILIDIR --  IBILI --  ALKPHOS 52  AST 12  ALT 6  PROT 6.6  ALBUMIN 3.5    PT/INR  Basename 11/07/10 0617 11/06/10 2332  LABPROT 14.7 13.9  INR 1.13 1.05    Imaging Studies: Ct Abdomen Pelvis W Contrast  11/06/2010  *RADIOLOGY REPORT*  Clinical Data: Abdominal pain right lower quadrant  CT ABDOMEN AND PELVIS WITH CONTRAST  Technique:  Multidetector CT imaging of the abdomen and pelvis was performed following the standard protocol during bolus administration of intravenous contrast.  Contrast: OMNIPAQUE IOHEXOL 300 MG/ML IV SOLN  Comparison: None.  Findings: Lung bases are clear.  No pleural or pericardial fluid. The liver has a normal appearance.  No calcified gallstones.  The spleen is normal.  The pancreas is normal.  The adrenal glands are normal.  The kidneys are normal.  The aorta is normal except for very minimal wall calcification.  No aneurysm.  The IVC is normal. No retroperitoneal mass or adenopathy.  No free intraperitoneal fluid or air.  The appendix is well seen as a normal structure. The uterus is normal.  The adnexal regions are normal.  No abnormality of the bladder.  No other bowel pathology.  Mild degenerative changes effect the lower lumbar spine.  IMPRESSION: No acute or significant finding.  Normal appearance of the appendix.  No cause of pain identified.  Original Report Authenticated By: Thomasenia Sales, M.D.  Pierre.Alas week]   Impression: Profound microcytic anemia in setting of frequent heavy menses, significant  family history of early onset colon cancer in two first degree relatives, intermittent melena and pp abdominal pain/GERD. Suspect majority of anemia secondary to uterine blood loss. She does have refractory GERD, pp abd pain, intermittent melena so her UGI needs to be evaluated. Also needs first time colonoscopy to rule out malignancy.   Outside of anemia, she has no other indications of Celiac disease.   Plan: #1 EGD today. #2 Check H/H after second unit of prbcs. Would transfuse to Hgb above 8. #3 PPI. #4 Colonoscopy tomorrow. #5 She will need treatment/management of her heavy menses given significant microcytic anemia. #6 F/U anemia panel. Add TTG for Celiac +/- consider SB biopsy at time of EGD based on findings.   I would like to thank the [facility] for allowing [place] to take part in the care of this nice patient.    LOS: 1 day   Tana Coast  11/07/2010, 9:17 AM  .I have seen and examined the patient prior to the procedure(s) today and reviewed the history and physical / consultation.  There have been no changes. After consideration of the risks, benefits, alternatives and imponderables, the patient has consented to the procedure(s).

## 2010-11-07 NOTE — Progress Notes (Signed)
Subjective: Patient feels well. She has no abdominal pain, nausea or vomiting. Last bowel movement was yesterday. She has no history of GI bleed. she feels hungry and would like to the home. She has received one unit of packed RBCs. Tolerated it well. Patient denies uterine bleeding. Patient denies hematuria. Patient denies alcohol intake, aspirin, and Goody powders, NSAIDs.  Objective: Vital signs in last 24 hours: Temp:  [98.1 F (36.7 C)-98.8 F (37.1 C)] 98.3 F (36.8 C) (10/11 0545) Pulse Rate:  [61-102] 64  (10/11 0545) Resp:  [18-20] 18  (10/11 0545) BP: (111-153)/(65-102) 111/65 mmHg (10/11 0545) SpO2:  [97 %-100 %] 98 % (10/11 0018) Weight:  [63.504 kg (140 lb)-63.507 kg (140 lb 0.1 oz)] 140 lb 0.1 oz (63.507 kg) (10/11 0018) Weight change:  Last BM Date: 11/06/10  Intake/Output from previous day: 10/10 0701 - 10/11 0700 In: 250 [I.V.:250] Out: -      Physical Exam: General: Alert, awake, oriented x3, in no acute distress. Patient looks pale. HEENT: No bruits, no goiter. Heart: Regular rate and rhythm, without murmurs, rubs, gallops. Lungs: Clear to auscultation bilaterally. Abdomen: Soft, nontender, nondistended, positive bowel sounds. Extremities: No clubbing cyanosis or edema with positive pedal pulses. Neuro: Grossly intact, nonfocal.    Lab Results: Basic Metabolic Panel:  Basename 11/07/10 0617 11/06/10 1925  NA 138 138  K 3.3* 3.3*  CL 105 106  CO2 24 24  GLUCOSE 95 100*  BUN 4* 6  CREATININE 0.59 0.57  CALCIUM 8.4 8.6  MG -- --  PHOS -- --   Liver Function Tests:  [facility] 11/06/10 1925  AST 12  ALT 6  ALKPHOS 52  BILITOT 0.2*  PROT 6.6  ALBUMIN 3.5    Basename 11/06/10 1925  LIPASE 28  AMYLASE --   No results found for this basename: AMMONIA:2 in the last 72 hours CBC:  Basename 11/07/10 0617 11/06/10 2332 11/06/10 1925  WBC 4.7 -- 6.5  NEUTROABS -- -- 3.0  HGB 5.5* 5.4* --  HCT 19.7* 20.2* --  MCV 59.3* -- 56.5*  PLT 124*  -- 178   Cardiac Enzymes: No results found for this basename: CKTOTAL:3,CKMB:3,CKMBINDEX:3,TROPONINI:3 in the last 72 hours BNP: No results found for this basename: POCBNP:3 in the last 72 hours D-Dimer: No results found for this basename: DDIMER:2 in the last 72 hours CBG: No results found for this basename: GLUCAP:6 in the last 72 hours Hemoglobin A1C: No results found for this basename: HGBA1C in the last 72 hours Fasting Lipid Panel: No results found for this basename: CHOL,HDL,LDLCALC,TRIG,CHOLHDL,LDLDIRECT in the last 72 hours Thyroid Function Tests: No results found for this basename: TSH,T4TOTAL,FREET4,T3FREE,THYROIDAB in the last 72 hours Anemia Panel:  Basename 11/06/10 2332  VITAMINB12 --  FOLATE --  FERRITIN --  TIBC --  IRON --  RETICCTPCT 1.2   Urine Drug Screen:  Alcohol Level: No results found for this basename: ETH:2 in the last 72 hours Urinalysis:  Misc. Labs:  No results found for this or any previous visit (from the past 240 hour(s)).  Studies/Results: Ct Abdomen Pelvis W Contrast  11/06/2010  *RADIOLOGY REPORT*  Clinical Data: Abdominal pain right lower quadrant  CT ABDOMEN AND PELVIS WITH CONTRAST  Technique:  Multidetector CT imaging of the abdomen and pelvis was performed following the standard protocol during bolus administration of intravenous contrast.  Contrast: OMNIPAQUE IOHEXOL 300 MG/ML IV SOLN  Comparison: None.  Findings: Lung bases are clear.  No pleural or pericardial fluid. The liver has a  normal appearance.  No calcified gallstones.  The spleen is normal.  The pancreas is normal.  The adrenal glands are normal.  The kidneys are normal.  The aorta is normal except for very minimal wall calcification.  No aneurysm.  The IVC is normal. No retroperitoneal mass or adenopathy.  No free intraperitoneal fluid or air.  The appendix is well seen as a normal structure. The uterus is normal.  The adnexal regions are normal.  No abnormality of the  bladder.  No other bowel pathology.  Mild degenerative changes effect the lower lumbar spine.  IMPRESSION: No acute or significant finding.  Normal appearance of the appendix.  No cause of pain identified.  Original Report Authenticated By: Thomasenia Sales, M.D.    Medications: Scheduled Meds:   . acetaminophen  650 mg Oral Once  . diphenhydrAMINE  25 mg Oral Once  .  HYDROmorphone (DILAUDID) injection  1 mg Intravenous Once  . nicotine  14 mg Transdermal Daily  . ondansetron (ZOFRAN) IV  4 mg Intravenous Once  . pantoprazole (PROTONIX) IV  80 mg Intravenous Once  . pantoprazole  40 mg Oral BID AC  . potassium chloride  20 mEq Oral BID  . DISCONTD: sodium chloride  1,000 mL Intravenous Once  . DISCONTD: sodium chloride  3 mL Intravenous Q12H   Continuous Infusions:   . DISCONTD: sodium chloride     PRN Meds:.acetaminophen, acetaminophen, albuterol, HYDROcodone-acetaminophen, iohexol, ipratropium, morphine, DISCONTD: sodium chloride  Assessment/Plan:  Principal Problem:  *Anemia, unspecified. Continue transfusion of 2 units of packed RBC. Repeat H&H at 12 noon. Active Problems:  Tobacco abuse. Nicotine patch 21 mg daily  Melena. Obtain GI consult  Hypokalemia. Replace with K. Dur 20 mEq by mouth twice a day x4 doses. Repeat basic metabolic panel in a.m.   LOS: 1 day   Jonny Ruiz 11/07/2010, 8:48 AM

## 2010-11-08 ENCOUNTER — Encounter (HOSPITAL_COMMUNITY)
Admission: EM | Discharge status: Against Medical Advice | Payer: Self-pay | Source: Home / Self Care | Attending: Internal Medicine

## 2010-11-08 ENCOUNTER — Other Ambulatory Visit (HOSPITAL_COMMUNITY): Payer: Self-pay

## 2010-11-08 ENCOUNTER — Inpatient Hospital Stay (HOSPITAL_COMMUNITY): Payer: Self-pay

## 2010-11-08 DIAGNOSIS — I10 Essential (primary) hypertension: Secondary | ICD-10-CM | POA: Diagnosis not present

## 2010-11-08 DIAGNOSIS — E611 Iron deficiency: Secondary | ICD-10-CM | POA: Diagnosis present

## 2010-11-08 DIAGNOSIS — E538 Deficiency of other specified B group vitamins: Secondary | ICD-10-CM | POA: Diagnosis not present

## 2010-11-08 DIAGNOSIS — N939 Abnormal uterine and vaginal bleeding, unspecified: Secondary | ICD-10-CM | POA: Diagnosis present

## 2010-11-08 LAB — BASIC METABOLIC PANEL
BUN: 4 mg/dL — ABNORMAL LOW (ref 6–23)
Chloride: 104 mEq/L (ref 96–112)
GFR calc Af Amer: >90 mL/min (ref >90)
Potassium: 3.9 mEq/L (ref 3.5–5.1)

## 2010-11-08 LAB — CBC
HCT: 33.6 % — ABNORMAL LOW (ref 36.0–46.0)
Platelets: 138 10*3/uL — ABNORMAL LOW (ref 150–400)
RDW: 28.2 % — ABNORMAL HIGH (ref 11.5–15.5)
WBC: 5.2 10*3/uL (ref 4.0–10.5)

## 2010-11-08 LAB — TISSUE TRANSGLUTAMINASE, IGA: Tissue Transglutaminase Ab, IgA: 3.6 U/mL (ref <20)

## 2010-11-08 SURGERY — COLONOSCOPY
Anesthesia: Moderate Sedation

## 2010-11-08 MED ORDER — FERROUS SULFATE 325 (65 FE) MG PO TABS: 325.0000 mg | ORAL_TABLET | Freq: Three times a day (TID) | ORAL | Status: DC

## 2010-11-08 MED ORDER — FOLIC ACID 1 MG PO TABS: 1.0000 mg | ORAL_TABLET | Freq: Every day | ORAL | Status: DC

## 2010-11-08 NOTE — Progress Notes (Signed)
Dr. Jena Gauss was notified of the pts concerns, and that she was threatening to leave AMA.  He stated to voice to the Pt that he was working to attempt to see her as soon as he could and to be a little patient and she would be able to have a big meal after the procedure was completed.    I voiced with the pt that Dr. Jena Gauss stated that she would really want to wait and have the procedure done before leaving, and that she could eat right after the procedure was done.  The patient still refuses to wait.  I told her I would bring her AMA papers.  Dr. Jordan Hawks was notified of pt complaints and concerns and threatening to leave AMA.  He recommended an sedative such as xanax to see if that would help.  Pt refuses to try the sedative and still wants to leave.  I asked the pt is she would be will to stay for u/s she says no.   Pt given AMA papers and left the floor in stable condition.

## 2010-11-08 NOTE — Progress Notes (Signed)
Went to pt room due to Physical therapist voicing to me that the patient was in the hall agitated because she was ready to have her procedure done because she was hungry and ready to go.  I voiced to her that I would notify the MD and see if the test could be rescheduled, per the pt's request for it to be.

## 2010-11-08 NOTE — Progress Notes (Addendum)
Subjective: Patient feels well. She underwent upper endoscopy this morning and it went well. She was found with a small hiatal hernia. Otherwise no lesions. She is waiting for colonoscopy later this morning. She has not had melena or hematochezia. She has not had uterine bleeding in the last 24 hours. She denies having a pelvic or vaginal ultrasound in the last 20 years. Denies chest pain, shortness of breath or palpitations. Denies urinary symptoms. She is hungry but she knows she has to stay up n.p.o. until colonoscopy is done  Objective: Vital signs in last 24 hours: Temp:  [97.2 F (36.2 C)-99.7 F (37.6 C)] 97.5 F (36.4 C) (10/12 0600) Pulse Rate:  [52-91] 52  (10/12 0716) Resp:  [16-29] 18  (10/12 0716) BP: (118-149)/(69-94) 138/92 mmHg (10/12 0600) SpO2:  [99 %-100 %] 100 % (10/12 0716) Weight change:  Last BM Date: 11/07/10  Intake/Output from previous day: 10/11 0701 - 10/12 0700 In: 1500.8 [I.V.:500; Blood:1000.8] Out: -      Physical Exam: General: Alert, awake, oriented x3, in no acute distress. Pale HEENT: No bruits, no goiter. Heart: Regular rate and rhythm, without murmurs, rubs, gallops. Lungs: Clear to auscultation bilaterally. Abdomen: Soft, nontender, nondistended, positive bowel sounds. Extremities: No clubbing cyanosis or edema with positive pedal pulses. Neuro: Grossly intact, nonfocal.    Lab Results: Basic Metabolic Panel:  Basename 11/08/10 0533 11/07/10 0617  NA 137 138  K 3.9 3.3*  CL 104 105  CO2 24 24  GLUCOSE 87 95  BUN 4* 4*  CREATININE 0.57 0.59  CALCIUM 9.5 8.4  MG -- --  PHOS -- --   Liver Function Tests:  [facility] 11/06/10 1925  AST 12  ALT 6  ALKPHOS 52  BILITOT 0.2*  PROT 6.6  ALBUMIN 3.5    Basename 11/06/10 1925  LIPASE 28  AMYLASE --   No results found for this basename: AMMONIA:2 in the last 72 hours CBC:  Basename 11/08/10 0533 11/07/10 1347 11/07/10 0617 11/06/10 1925  WBC 5.2 -- 4.7 --  NEUTROABS -- --  -- 3.0  HGB 10.1* 7.7* -- --  HCT 33.6* 26.5* -- --  MCV 66.5* -- 59.3* --  PLT 138* -- 124* --   Cardiac Enzymes: No results found for this basename: CKTOTAL:3,CKMB:3,CKMBINDEX:3,TROPONINI:3 in the last 72 hours BNP: No results found for this basename: POCBNP:3 in the last 72 hours D-Dimer: No results found for this basename: DDIMER:2 in the last 72 hours CBG: No results found for this basename: GLUCAP:6 in the last 72 hours Hemoglobin A1C: No results found for this basename: HGBA1C in the last 72 hours Fasting Lipid Panel: No results found for this basename: CHOL,HDL,LDLCALC,TRIG,CHOLHDL,LDLDIRECT in the last 72 hours Thyroid Function Tests: No results found for this basename: TSH,T4TOTAL,FREET4,T3FREE,THYROIDAB in the last 72 hours Anemia Panel:  Basename 11/06/10 2332  VITAMINB12 257  FOLATE 3.2*  FERRITIN 3*  TIBC 398  IRON 10*  RETICCTPCT 1.2   Urine Drug Screen:  Alcohol Level: No results found for this basename: ETH:2 in the last 72 hours Urinalysis:  Misc. Labs:  No results found for this or any previous visit (from the past 240 hour(s)).  Studies/Results: Ct Abdomen Pelvis W Contrast  11/06/2010  *RADIOLOGY REPORT*  Clinical Data: Abdominal pain right lower quadrant  CT ABDOMEN AND PELVIS WITH CONTRAST  Technique:  Multidetector CT imaging of the abdomen and pelvis was performed following the standard protocol during bolus administration of intravenous contrast.  Contrast: OMNIPAQUE IOHEXOL 300 MG/ML IV SOLN  Comparison: None.  Findings: Lung bases are clear.  No pleural or pericardial fluid. The liver has a normal appearance.  No calcified gallstones.  The spleen is normal.  The pancreas is normal.  The adrenal glands are normal.  The kidneys are normal.  The aorta is normal except for very minimal wall calcification.  No aneurysm.  The IVC is normal. No retroperitoneal mass or adenopathy.  No free intraperitoneal fluid or air.  The appendix is well seen as  a normal structure. The uterus is normal.  The adnexal regions are normal.  No abnormality of the bladder.  No other bowel pathology.  Mild degenerative changes effect the lower lumbar spine.  IMPRESSION: No acute or significant finding.  Normal appearance of the appendix.  No cause of pain identified.  Original Report Authenticated By: Thomasenia Sales, M.D.    Medications: Scheduled Meds:   . sodium chloride   Intravenous Once  . sodium chloride   Intravenous Once  . bisacodyl  10 mg Oral Once  . diphenhydrAMINE  25 mg Oral Once  . ferrous sulfate  325 mg Oral TID WC  . folic acid  1 mg Oral Daily  .  HYDROmorphone (DILAUDID) injection  1 mg Intravenous Once  . nicotine  14 mg Transdermal Daily  . ondansetron (ZOFRAN) IV  4 mg Intravenous Once  . pantoprazole (PROTONIX) IV  80 mg Intravenous Once  . pantoprazole  40 mg Oral BID AC  . polyethylene glycol  2,000 mL Oral Once  . polyethylene glycol  2,000 mL Oral Once  . potassium chloride  20 mEq Oral BID  . sodium chloride      . sodium chloride      . sodium chloride      . DISCONTD: acetaminophen  650 mg Oral Once  . DISCONTD: bisacodyl  10 mg Oral Once  . DISCONTD: meperidine      . DISCONTD: midazolam      . DISCONTD: nicotine  14 mg Transdermal Daily  . DISCONTD: pantoprazole  40 mg Oral BID AC  . DISCONTD: polyethylene glycol  2,000 mL Oral Once  . DISCONTD: polyethylene glycol  2,000 mL Oral Once  . DISCONTD: potassium chloride  20 mEq Oral BID   Continuous Infusions:  PRN Meds:.acetaminophen, albuterol, HYDROcodone-acetaminophen, ipratropium, morphine, DISCONTD: acetaminophen, DISCONTD: acetaminophen, DISCONTD: albuterol, DISCONTD: butamben-tetracaine-benzocaine, DISCONTD: HYDROcodone-acetaminophen, DISCONTD: ipratropium, DISCONTD: meperidine, DISCONTD: midazolam, DISCONTD: morphine  Assessment/Plan:  Principal Problem:  *Anemia, unspecified. Status post transfusion of 4 units of pack rbc's. Workup in progress. Profound  iron and folate deficient. She will undergo colonoscopy later today. New order: Pelvic transvaginal ultrasound, assess endometrial stripe and rule out uterine tumors Active Problems:  Melena. No evidence of upper GI bleed  Abnormal uterine. bleeding obtain transvaginal ultrasound  HTN (hypertension) elevated blood pressure without history of hypertension, moderate, we'll continue to watch, treat if needed  Iron deficiency start her sulfate 325 mg by mouth 3 times a day  Folate deficiency start folic acid 1 mg by mouth daily  Tobacco abuse. Patient was advised to quit smoking. Continue nicotine patch  Disposition: The discharge home later today if colonoscopy normal    LOS: 2 days   Jonny Ruiz 11/08/2010, 8:40 AM

## 2010-11-08 NOTE — Discharge Summary (Signed)
Physician Discharge Summary  Patient ID: [name] MRN: [id number] DOB/AGE: [id number] y.o.  Admit date: 11/06/2010 Discharge date: 11/08/2010 PATIENT LEFT AMA  Primary Care Physician:  No primary provider on file.   Discharge Diagnoses:    Present on Admission:  .Anemia, unspecified .Tobacco abuse .Melena .Hypokalemia .Abnormal uterine bleeding .Iron deficiency  Discharge Medication List as of 11/08/2010 11:12 AM    CONTINUE these medications which have NOT CHANGED   Details  aspirin-acetaminophen-caffeine (HEADACHE RELIEF) 250-250-65 MG per tablet Take 2 tablets by mouth as needed. For headaches , Until Discontinued, Historical Med    Oreg-Peppermint-Thyme-Gldnseal (YEAST FORMULA PO) Take 1 tablet by mouth 3 (three) times daily as needed. For yeast infection , Until Discontinued, Historical Med         Disposition and Follow-up: PATIENT LEFT AMA   Consults: GI  Significant Diagnostic Studies:  Ct Abdomen Pelvis W Contrast  11/06/2010  *RADIOLOGY REPORT*  Clinical Data: Abdominal pain right lower quadrant  CT ABDOMEN AND PELVIS WITH CONTRAST  Technique:  Multidetector CT imaging of the abdomen and pelvis was performed following the standard protocol during bolus administration of intravenous contrast.  Contrast: OMNIPAQUE IOHEXOL 300 MG/ML IV SOLN  Comparison: None.  Findings: Lung bases are clear.  No pleural or pericardial fluid. The liver has a normal appearance.  No calcified gallstones.  The spleen is normal.  The pancreas is normal.  The adrenal glands are normal.  The kidneys are normal.  The aorta is normal except for very minimal wall calcification.  No aneurysm.  The IVC is normal. No retroperitoneal mass or adenopathy.  No free intraperitoneal fluid or air.  The appendix is well seen as a normal structure. The uterus is normal.  The adnexal regions are normal.  No abnormality of the bladder.  No other bowel pathology.  Mild degenerative changes  effect the lower lumbar spine.  IMPRESSION: No acute or significant finding.  Normal appearance of the appendix.  No cause of pain identified.  Original Report Authenticated By: Thomasenia Sales, M.D.    Brief H and P: This is a 45 y/o female with no significant past medical history, who today developed severe right sided abdominal pain, which woke here from sleep. She states it was sharp and intermittent. Pain continued to worsen during the course of the day. She finally present to the ER. She reports no nausea, no vomiting, no diarrhea. No blood in her urine, no burning on urination. No fevers, no chills.  In the ER blood work revealed a hemoglobin of 5. The patient states she has been having dark stools on and off for approximately 6 months. She does not report using spicy food, no NSAIDs. No h/o PUD. No significant alcohol use. She has never had a colonoscopy. She does have a pretty significant family history of colon cancer. He deceased father had colon cancer in his 79's, he brother aged 67 was recently diagnosed with colon cancer. She reports good appetite, no weight loss. She does report menorrhagia.   Examination prior to patient leaving AMA today:  Vital signs in last 24 hours:  Temp: [97.2 F (36.2 C)-99.7 F (37.6 C)] 97.5 F (36.4 C) (10/12 0600)  Pulse Rate: [52-91] 52 (10/12 0716)  Resp: [16-29] 18 (10/12 0716)  BP: (118-149)/(69-94) 138/92 mmHg (10/12 0600)  SpO2: [99 %-100 %] 100 % (10/12 0716)  Weight change:  Last BM Date: 11/07/10  Intake/Output from previous day:  10/11 0701 - 10/12 0700  In:  1500.8 [I.V.:500; Blood:1000.8]  Out: -   Physical Exam:  General: Alert, awake, oriented x3, in no acute distress. Pale  HEENT: No bruits, no goiter.  Heart: Regular rate and rhythm, without murmurs, rubs, gallops.  Lungs: Clear to auscultation bilaterally.  Abdomen: Soft, nontender, nondistended, positive bowel sounds.  Extremities: No clubbing cyanosis or edema with positive  pedal pulses.  Neuro: Grossly intact, nonfocal.  Lab Results:  Basic Metabolic Panel:   Basename  11/08/10 0533  11/07/10 0617   NA  137  138   K  3.9  3.3*   CL  104  105   CO2  24  24   GLUCOSE  87  95   BUN  4*  4*   CREATININE  0.57  0.59   CALCIUM  9.5  8.4   MG  --  --   PHOS  --  --    Liver Function Tests:   [facility]  11/06/10 1925   AST  12   ALT  6   ALKPHOS  52   BILITOT  0.2*   PROT  6.6   ALBUMIN  3.5     Basename  11/06/10 1925   LIPASE  28   AMYLASE  --    No results found for this basename: AMMONIA:2 in the last 72 hours  CBC:   Basename  11/08/10 0533  11/07/10 1347  11/07/10 0617  11/06/10 1925   WBC  5.2  --  4.7  --   NEUTROABS  --  --  --  3.0   HGB  10.1*  7.7*  --  --   HCT  33.6*  26.5*  --  --   MCV  66.5*  --  59.3*  --   PLT  138*  --  124*  --    Cardiac Enzymes:  No results found for this basename: CKTOTAL:3,CKMB:3,CKMBINDEX:3,TROPONINI:3 in the last 72 hours  BNP:  No results found for this basename: POCBNP:3 in the last 72 hours  D-Dimer:  No results found for this basename: DDIMER:2 in the last 72 hours  CBG:  No results found for this basename: GLUCAP:6 in the last 72 hours  Hemoglobin A1C:  No results found for this basename: HGBA1C in the last 72 hours  Fasting Lipid Panel:  No results found for this basename: CHOL,HDL,LDLCALC,TRIG,CHOLHDL,LDLDIRECT in the last 72 hours  Thyroid Function Tests:  No results found for this basename: TSH,T4TOTAL,FREET4,T3FREE,THYROIDAB in the last 72 hours  Anemia Panel:   Basename  11/06/10 2332   VITAMINB12  257   FOLATE  3.2*   FERRITIN  3*   TIBC  398   IRON  10*   RETICCTPCT  1.2        Hospital Course:  Principal Problem:  *Anemia, iron and folate deficiency; the patient was found with critical anemia on admission requiring transfusion of 4 units of packed RBCs. The patient tolerated transfusion well. Hemoglobin improved from 5-10. Active Problems:  Melena. The patient  underwent upper endoscopy today and she was found with a small hiatal hernia. She was scheduled to undergo colonoscopy later today unfortunately she decided to leave AMA  Abnormal uterine bleeding. The patient did not have recurrences of uterine bleeding while in the hospital. She was scheduled to undergo transvaginal ultrasound later today but unfortunately she left AMA  HTN (hypertension). Only noted earlier this morning  Iron deficiency  Folate deficiency  Tobacco abuse. Pt advised to quit smoking. Was kept on Nicotine patch.  Time spent on Discharge: 10 minutes  Signed: Jonny Ruiz 11/08/2010, 1:15 PM

## 2010-11-09 LAB — TYPE AND SCREEN
ABO/RH(D): O POS
Antibody Screen: NEGATIVE
Unit division: 0
Unit division: 0
Unit division: 0

## 2010-11-12 NOTE — Progress Notes (Signed)
Quick Note:  Patient left hospital AMA before TCS done. Celiac ab negative. Let's offer to schedule TCS with RMR for IDA, FH CRC. ______

## 2010-11-14 ENCOUNTER — Encounter (HOSPITAL_COMMUNITY): Payer: Self-pay | Admitting: Internal Medicine

## 2010-11-21 ENCOUNTER — Encounter: Payer: Self-pay | Admitting: Internal Medicine

## 2010-11-21 ENCOUNTER — Telehealth: Payer: Self-pay

## 2010-11-21 NOTE — Telephone Encounter (Signed)
LATE ENTRY:  Pt had received a letter to call and get scheduled for a colonoscopy. ( She had left AMA when in the hospital). She was supposed to be scheduled for IDA and FH of CRC. She said she did not have any insurance. So I called Lubertha Basque today. She said she had spoken with the pt briefly. She will send papers to pt and she had already told her she would need a letter of support from significant other. I called pt and told her that we needed to put a hold on this procedure right now. She will fill out paper work and we will see what happens.

## 2011-01-01 NOTE — Telephone Encounter (Signed)
I called [name]. She said that pt has not filled out any papers for any benefits.

## 2011-01-22 NOTE — Telephone Encounter (Signed)
Mailed pt a letter to call and get scheduled for her colonoscopy.

## 2011-07-26 ENCOUNTER — Emergency Department (HOSPITAL_COMMUNITY): Payer: Self-pay

## 2011-07-26 ENCOUNTER — Encounter (HOSPITAL_COMMUNITY): Payer: Self-pay | Admitting: Emergency Medicine

## 2011-07-26 ENCOUNTER — Emergency Department (HOSPITAL_COMMUNITY)
Admission: EM | Admit: 2011-07-26 | Discharge: 2011-07-26 | Disposition: A | Discharge status: Home / Self Care | Payer: Self-pay | Attending: Emergency Medicine | Admitting: Emergency Medicine

## 2011-07-26 DIAGNOSIS — J449 Chronic obstructive pulmonary disease, unspecified: Secondary | ICD-10-CM | POA: Insufficient documentation

## 2011-07-26 DIAGNOSIS — S63509A Unspecified sprain of unspecified wrist, initial encounter: Secondary | ICD-10-CM | POA: Insufficient documentation

## 2011-07-26 DIAGNOSIS — X58XXXA Exposure to other specified factors, initial encounter: Secondary | ICD-10-CM | POA: Insufficient documentation

## 2011-07-26 DIAGNOSIS — J4489 Other specified chronic obstructive pulmonary disease: Secondary | ICD-10-CM | POA: Insufficient documentation

## 2011-07-26 DIAGNOSIS — F17200 Nicotine dependence, unspecified, uncomplicated: Secondary | ICD-10-CM | POA: Insufficient documentation

## 2011-07-26 NOTE — Discharge Instructions (Signed)
The x-ray of your hand and wrist are negative for acute problem. Your examination suggested a possible strain or sprain of the area. Please use the splint for the next 10 days. Please see the orthopedist listed above if you continue to have problems with this area.Sprains Sprains are painful injuries to joints as a result of partial or complete tearing of ligaments. HOME CARE INSTRUCTIONS   For the first 24 hours, keep the injured limb raised on 2 pillows while lying down.   Apply ice bags about every 2 hours for 20 to 30 minutes, while awake, to the injured area for the first 24 hours. Then apply as directed by your caregiver. Place the ice in a plastic bag with a towel around it to prevent frostbite to the skin.   Only take over-the-counter or prescription medicines for pain, discomfort, or fever as directed by your caregiver.   If an ace bandage (a stretchy, elastic wrapping bandage) has been applied today, remove and reapply every 3 to 4 hours. Apply firm enough to keep swelling down. Donot apply tightly. Watch fingers or toes for swelling, bluish discoloration, coldness, numbness, or excessive pain. If any of these problems (symptoms) occur, remove the ace bandage and reapply it more loosely. Contact your caregiver or return to this location if these symptoms persist.  Persistent pain and inability to use the injured area for more than 2 to 3 days are warning signs. See a caregiver for a follow-up visit as soon as possible. A hairline fracture (broken bone) may not show on X-rays. Persistent pain and swelling indicate that further evaluation, use of crutches, and/or more X-rays are needed. X-rays may sometimes not show a small fracture until a week or ten days later. Make a follow-up appointment with your own caregiver or to whom we have referred you. A specialist in reading X-rays(radiologist) will re-read your X-rays. Make sure you know how to obtain your X-ray results. Do not assume everything is  normal if you do not hear from [place]. SEEK IMMEDIATE MEDICAL CARE IF:  You develop severe pain or more swelling.   The pain is not controlled with medicine.   Your skin or nails below the injury turn blue or grey or feel cold or numb.  Document Released: 01/11/2000 Document Revised: 01/02/2011 Document Reviewed: 08/30/2007 [facility] Patient Information 2012 [place], [place].

## 2011-07-26 NOTE — ED Provider Notes (Signed)
History     CSN: [id number]  Arrival date & time 07/26/11  1155   First MD Initiated Contact with Patient 07/26/11 1232      Chief Complaint  Patient presents with  . Wrist Pain    (Consider location/radiation/quality/duration/timing/severity/associated sxs/prior treatment) Patient is a 46 y.o. female presenting with wrist pain. The history is provided by the patient.  Wrist Pain This is a new problem. The current episode started in the past 7 days. The problem occurs daily. The problem has been gradually worsening. Associated symptoms include numbness. Pertinent negatives include no abdominal pain, arthralgias, chest pain, coughing, neck pain or weakness. Exacerbated by: bending the wrist, flexing the fingers. She has tried NSAIDs for the symptoms. The treatment provided no relief.    Past Medical History  Diagnosis Date  . COPD (chronic obstructive pulmonary disease)     Past Surgical History  Procedure Date  . Tubal ligation   . Cyst removed from right face   . Esophagogastroduodenoscopy 11/07/2010    Procedure: ESOPHAGOGASTRODUODENOSCOPY (EGD);  Surgeon: Corbin Ade, MD;  Location: [facility];  Service: Endoscopy;  Laterality: N/A;    Family History  Problem Relation Age of Onset  . Colon cancer Father 62    deceased secondary to kidney failure  . Colon cancer Brother 79    recent diagnosis (2012). Finishing chemotherapy.    History  Substance Use Topics  . Smoking status: Current Everyday Smoker -- 1.0 packs/day for 20 years    Types: Cigarettes  . Smokeless tobacco: Never Used  . Alcohol Use: No    OB History    Grav Para Term Preterm Abortions TAB SAB Ect Mult Living   2 2 2       2       Review of Systems  Constitutional: Negative for activity change.       All ROS Neg except as noted in HPI  HENT: Negative for nosebleeds and neck pain.   Eyes: Negative for photophobia and discharge.  Respiratory: Positive for shortness of breath. Negative for  cough and wheezing.   Cardiovascular: Negative for chest pain and palpitations.  Gastrointestinal: Negative for abdominal pain and blood in stool.  Genitourinary: Negative for dysuria, frequency and hematuria.  Musculoskeletal: Negative for back pain and arthralgias.  Skin: Negative.   Neurological: Positive for numbness. Negative for dizziness, seizures, speech difficulty and weakness.  Psychiatric/Behavioral: Negative for hallucinations and confusion.    Allergies  Review of patient's allergies indicates no known allergies.  Home Medications   Current Outpatient Rx  Name Route Sig Dispense Refill  . ASPIRIN-ACETAMINOPHEN-CAFFEINE 250-250-65 MG PO TABS Oral Take 2 tablets by mouth as needed. For headaches     . NAPROXEN SODIUM 220 MG PO TABS Oral Take 440 mg by mouth daily as needed. For pain      BP 186/98  Pulse 66  Temp 98.5 F (36.9 C) (Oral)  Resp 18  Ht 5\' 3"  (1.6 m)  Wt 145 lb (65.772 kg)  BMI 25.69 kg/m2  SpO2 100%  LMP 07/23/2011  Physical Exam  Nursing note and vitals reviewed. Constitutional: She is oriented to person, place, and time. She appears well-developed and well-nourished.  Non-toxic appearance.  HENT:  Head: Normocephalic.  Right Ear: Tympanic membrane and external ear normal.  Left Ear: Tympanic membrane and external ear normal.  Eyes: EOM and lids are normal. Pupils are equal, round, and reactive to light.  Neck: Normal range of motion. Neck supple. Carotid bruit is  not present.  Cardiovascular: Normal rate, regular rhythm, normal heart sounds, intact distal pulses and normal pulses.   Pulmonary/Chest: Breath sounds normal. No respiratory distress.       Rhonchi with occasional wheeze appreciated.  Abdominal: Soft. Bowel sounds are normal. There is no tenderness. There is no guarding.  Musculoskeletal: Normal range of motion.       There is full range of motion of the left shoulder and elbow. There is no deformity of the humerus area. No deformity  of the ulnar or radius area. There is pain and soreness of the lateral wrist and lateral hand with attempted range of motion. There is soreness with flexing of the fingers. Is good capillary refill. No sensory deficits appreciated. No pain in the anatomical snuff box.  Lymphadenopathy:       Head (right side): No submandibular adenopathy present.       Head (left side): No submandibular adenopathy present.    She has no cervical adenopathy.  Neurological: She is alert and oriented to person, place, and time. She has normal strength. No cranial nerve deficit or sensory deficit.  Skin: Skin is warm and dry.  Psychiatric: She has a normal mood and affect. Her speech is normal.    ED Course  Procedures (including critical care time)  Labs Reviewed - No data to display Dg Wrist Complete Left  07/26/2011  *RADIOLOGY REPORT*  Clinical Data: Pain  LEFT WRIST - COMPLETE 3+ VIEW  Comparison: None.  Findings: Carpal rows intact. Negative for fracture, dislocation, or other acute abnormality.  Normal alignment and mineralization. No significant degenerative change.  Regional soft tissues unremarkable.  IMPRESSION:  Negative  Original Report Authenticated By: Osa Craver, M.D.     1. Sprain of wrist       MDM  I have reviewed nursing notes, vital signs, and all appropriate lab and imaging results for this patient. The x-ray of the left wrist and hand are negative. Patient treated with a splint. She is to continue her naproxen for soreness. She is to see the orthopedist if not improving. Patient also treated with a wrist splint for the next 10 days.       Kathie Dike, [place] 07/26/11 1419

## 2011-07-26 NOTE — ED Notes (Signed)
Patient c/o left wrist pain after tripping and falling over grandchildren's toys. Patient tried to catch self. Per patient tried ibuprofen first with no relief then took   half of a percocet with relief.

## 2011-07-26 NOTE — ED Provider Notes (Signed)
Medical screening examination/treatment/procedure(s) were performed by non-physician practitioner and as supervising physician I was immediately available for consultation/collaboration. Devoria Albe, MD, Armando Gang   Ward Givens, MD 07/26/11 [phone]

## 2012-07-06 ENCOUNTER — Encounter (HOSPITAL_COMMUNITY): Payer: Self-pay | Admitting: Emergency Medicine

## 2012-07-06 ENCOUNTER — Emergency Department (HOSPITAL_COMMUNITY): Payer: Self-pay

## 2012-07-06 ENCOUNTER — Observation Stay (HOSPITAL_COMMUNITY)
Admission: EM | Admit: 2012-07-06 | Discharge: 2012-07-07 | Disposition: A | Discharge status: Home / Self Care | Payer: MEDICAID | Attending: Family Medicine | Admitting: Family Medicine

## 2012-07-06 DIAGNOSIS — R5381 Other malaise: Secondary | ICD-10-CM | POA: Insufficient documentation

## 2012-07-06 DIAGNOSIS — D649 Anemia, unspecified: Secondary | ICD-10-CM

## 2012-07-06 DIAGNOSIS — N938 Other specified abnormal uterine and vaginal bleeding: Secondary | ICD-10-CM | POA: Insufficient documentation

## 2012-07-06 DIAGNOSIS — R109 Unspecified abdominal pain: Secondary | ICD-10-CM

## 2012-07-06 DIAGNOSIS — D509 Iron deficiency anemia, unspecified: Principal | ICD-10-CM | POA: Insufficient documentation

## 2012-07-06 DIAGNOSIS — E611 Iron deficiency: Secondary | ICD-10-CM | POA: Diagnosis present

## 2012-07-06 DIAGNOSIS — R5383 Other fatigue: Secondary | ICD-10-CM | POA: Insufficient documentation

## 2012-07-06 DIAGNOSIS — N949 Unspecified condition associated with female genital organs and menstrual cycle: Secondary | ICD-10-CM | POA: Insufficient documentation

## 2012-07-06 DIAGNOSIS — N939 Abnormal uterine and vaginal bleeding, unspecified: Secondary | ICD-10-CM | POA: Diagnosis present

## 2012-07-06 DIAGNOSIS — N921 Excessive and frequent menstruation with irregular cycle: Secondary | ICD-10-CM | POA: Insufficient documentation

## 2012-07-06 HISTORY — DX: Pleurisy: R09.1

## 2012-07-06 HISTORY — DX: Anemia, unspecified: D64.9

## 2012-07-06 LAB — COMPREHENSIVE METABOLIC PANEL
ALT: 5 U/L (ref 0–35)
AST: 14 U/L (ref 0–37)
Alkaline Phosphatase: 53 U/L (ref 39–117)
GFR calc Af Amer: 85 mL/min — ABNORMAL LOW (ref >90)
Glucose, Bld: 117 mg/dL — ABNORMAL HIGH (ref 70–99)
Potassium: 3.5 mEq/L (ref 3.5–5.1)
Sodium: 141 mEq/L (ref 135–145)
Total Protein: 7.5 g/dL (ref 6.0–8.3)

## 2012-07-06 LAB — CBC WITH DIFFERENTIAL/PLATELET
Basophils Absolute: 0 10*3/uL (ref 0.0–0.1)
Basophils Relative: 1 % (ref 0–1)
HCT: 23.8 % — ABNORMAL LOW (ref 36.0–46.0)
Hemoglobin: 6.6 g/dL — CL (ref 12.0–15.0)
Lymphocytes Relative: 55 % — ABNORMAL HIGH (ref 12–46)
Monocytes Relative: 9 % (ref 3–12)
Neutro Abs: 1.5 10*3/uL — ABNORMAL LOW (ref 1.7–7.7)
RDW: 20.2 % — ABNORMAL HIGH (ref 11.5–15.5)
WBC: 4.4 10*3/uL (ref 4.0–10.5)

## 2012-07-06 LAB — RETICULOCYTES
RBC.: 4.31 MIL/uL (ref 3.87–5.11)
Retic Count, Absolute: 56 10*3/uL (ref 19.0–186.0)

## 2012-07-06 LAB — URINE MICROSCOPIC-ADD ON

## 2012-07-06 LAB — PREGNANCY, URINE: Preg Test, Ur: NEGATIVE

## 2012-07-06 LAB — URINALYSIS, ROUTINE W REFLEX MICROSCOPIC
Bilirubin Urine: NEGATIVE
Glucose, UA: NEGATIVE mg/dL
Specific Gravity, Urine: <1.005 — ABNORMAL LOW (ref 1.005–1.030)
pH: 6.5 (ref 5.0–8.0)

## 2012-07-06 MED ORDER — IOHEXOL 300 MG/ML  SOLN
100.0000 mL | Freq: Once | INTRAMUSCULAR | Status: AC | PRN
  Administered 2012-07-06: 100 mL via INTRAVENOUS

## 2012-07-06 MED ORDER — ONDANSETRON HCL 4 MG/2ML IJ SOLN
4.0000 mg | Freq: Once | INTRAMUSCULAR | Status: AC
  Administered 2012-07-06: 4 mg via INTRAVENOUS
  Filled 2012-07-06: qty 2

## 2012-07-06 MED ORDER — SODIUM CHLORIDE 0.9 % IV SOLN: INTRAVENOUS | Status: DC

## 2012-07-06 MED ORDER — MORPHINE SULFATE 4 MG/ML IJ SOLN
4.0000 mg | Freq: Once | INTRAMUSCULAR | Status: AC
  Administered 2012-07-06: 4 mg via INTRAVENOUS
  Filled 2012-07-06: qty 1

## 2012-07-06 MED ORDER — SODIUM CHLORIDE 0.9 % IV BOLUS (SEPSIS)
1000.0000 mL | Freq: Once | INTRAVENOUS | Status: AC
  Administered 2012-07-06: 1000 mL via INTRAVENOUS

## 2012-07-06 MED ORDER — IOHEXOL 300 MG/ML  SOLN
50.0000 mL | Freq: Once | INTRAMUSCULAR | Status: AC | PRN
  Administered 2012-07-06: 50 mL via ORAL

## 2012-07-06 NOTE — ED Provider Notes (Signed)
History     CSN: [id number]  Arrival date & time 07/06/12  2010   First MD Initiated Contact with Patient 07/06/12 2021      Chief Complaint  Patient presents with  . Weakness  . Abdominal Pain    (Consider location/radiation/quality/duration/timing/severity/associated sxs/prior treatment) Patient is a 47 y.o. female presenting with weakness and abdominal pain. The history is provided by the patient.  Weakness Associated symptoms include abdominal pain.  Abdominal Pain Associated symptoms include abdominal pain.   patient here complaining of a one-day history of lower abdominal pain with fever and weakness. Notes watery diarrhea without vomiting. No dysuria or hematuria. Pain is localized to her left or right lower quadrants. No black or bloody stools. No prior history of same. Nothing made her symptoms better or worse and no treatment used prior to arrival  Past Medical History  Diagnosis Date  . COPD (chronic obstructive pulmonary disease)   . Anemia   . Pleurisy     Past Surgical History  Procedure Laterality Date  . Tubal ligation    . Cyst removed from right face    . Esophagogastroduodenoscopy  11/07/2010    Procedure: ESOPHAGOGASTRODUODENOSCOPY (EGD);  Surgeon: Corbin Ade, MD;  Location: [facility];  Service: Endoscopy;  Laterality: N/A;    Family History  Problem Relation Age of Onset  . Colon cancer Father 63    deceased secondary to kidney failure  . Colon cancer Brother 26    recent diagnosis (2012). Finishing chemotherapy.    History  Substance Use Topics  . Smoking status: Current Every Day Smoker -- 1.00 packs/day for 20 years    Types: Cigarettes  . Smokeless tobacco: Never Used  . Alcohol Use: No    OB History   Grav Para Term Preterm Abortions TAB SAB Ect Mult Living   2 2 2       2       Review of Systems  Gastrointestinal: Positive for abdominal pain.  Neurological: Positive for weakness.  All other systems reviewed and are  negative.    Allergies  Review of patient's allergies indicates no known allergies.  Home Medications   Current Outpatient Rx  Name  Route  Sig  Dispense  Refill  . aspirin-acetaminophen-caffeine (HEADACHE RELIEF) 250-250-65 MG per tablet   Oral   Take 2 tablets by mouth as needed. For headaches          . naproxen sodium (ANAPROX) 220 MG tablet   Oral   Take 440 mg by mouth daily as needed. For pain           BP 132/85  Pulse 85  Temp(Src) 99.1 F (37.3 C) (Oral)  Resp 20  Ht 5\' 3"  (1.6 m)  Wt 135 lb (61.236 kg)  BMI 23.92 kg/m2  SpO2 98%  LMP 06/05/2012  Physical Exam  Nursing note and vitals reviewed. Constitutional: She is oriented to person, place, and time. She appears well-developed and well-nourished.  Non-toxic appearance. No distress.  HENT:  Head: Normocephalic and atraumatic.  Eyes: Conjunctivae, EOM and lids are normal. Pupils are equal, round, and reactive to light.  Neck: Normal range of motion. Neck supple. No tracheal deviation present. No mass present.  Cardiovascular: Normal rate, regular rhythm and normal heart sounds.  Exam reveals no gallop.   No murmur heard. Pulmonary/Chest: Effort normal and breath sounds normal. No stridor. No respiratory distress. She has no decreased breath sounds. She has no wheezes. She has no rhonchi. She has  no rales.  Abdominal: Soft. Normal appearance and bowel sounds are normal. She exhibits no distension. There is no tenderness. There is no rigidity, no rebound, no guarding and no CVA tenderness.    Musculoskeletal: Normal range of motion. She exhibits no edema and no tenderness.  Neurological: She is alert and oriented to person, place, and time. She has normal strength. No cranial nerve deficit or sensory deficit. GCS eye subscore is 4. GCS verbal subscore is 5. GCS motor subscore is 6.  Skin: Skin is warm and dry. No abrasion and no rash noted.  Psychiatric: She has a normal mood and affect. Her speech is  normal and behavior is normal.    ED Course  Procedures (including critical care time)  Labs Reviewed  CBC WITH DIFFERENTIAL  COMPREHENSIVE METABOLIC PANEL  LIPASE, BLOOD  URINALYSIS, ROUTINE W REFLEX MICROSCOPIC  PREGNANCY, URINE   No results found.   No diagnosis found.    MDM  Pt severe anemia of 6.6. She denies any lower GI bleeding but does have a history of heavy menstrual periods requiring blood transfusions and that is the likely reason for her anemia at this time. Her abdominal CT is pending at this time. She has no complaints of vaginal bleeding. Care to be signed out to Dr. Jennefer Bravo, MD 07/06/12 2133

## 2012-07-06 NOTE — ED Notes (Signed)
CRITICAL VALUE ALERT  Critical value received: HGB 6.6  Date of notification:  07/06/2012  Time of notification:  2128  Critical value read back HGB 6.6  Nurse who received alert:  JLR   MD notified (1st page):  2128  Time of first page:2128  MD notified (2nd page):  Time of second page:  Responding MD:    Time MD responded:

## 2012-07-06 NOTE — ED Notes (Signed)
Patient complaining of generalized weakness and fever starting this morning. Also complaining of abdominal pain.

## 2012-07-06 NOTE — H&P (Addendum)
PCP:   No PCP Per Patient   Chief Complaint:  Weakness  HPI: 47 year old female with a history of abnormal uterine bleeding and anemia in the past requiring blood transfusions was brought to the ED after patient complained of weakness also abdominal pain. Patient had a CT abdomen in the ED which was negative for acute abnormality. Patient is found to be severely anemic and she does with that she does get periods twice a month and has not seen a GYN lately as she has no insurance. She denies chest pain or shortness of breath denies passing out denies headache blurred vision no nausea vomiting but complains of some diarrhea today.  Allergies:  No Known Allergies    Past Medical History  Diagnosis Date  . COPD (chronic obstructive pulmonary disease)   . Anemia   . Pleurisy     Past Surgical History  Procedure Laterality Date  . Tubal ligation    . Cyst removed from right face    . Esophagogastroduodenoscopy  11/07/2010    Procedure: ESOPHAGOGASTRODUODENOSCOPY (EGD);  Surgeon: Corbin Ade, MD;  Location: [facility];  Service: Endoscopy;  Laterality: N/A;    Prior to Admission medications   Not on File    Social History:  reports that she has been smoking Cigarettes.  She has a 20 pack-year smoking history. She has never used smokeless tobacco. She reports that she uses illicit drugs (Marijuana). She reports that she does not drink alcohol.  Family History  Problem Relation Age of Onset  . Colon cancer Father 19    deceased secondary to kidney failure  . Colon cancer Brother 3    recent diagnosis (2012). Finishing chemotherapy.    Review of Systems:  HEENT: Denies headache, blurred vision, runny nose, sore throat,  Neck: Denies thyroid problems,lymphadenopathy Chest : Denies shortness of breath, no history of COPD Heart : Denies Chest pain,  coronary arterey disease GI: Denies  nausea, vomiting, diarrhea, constipation GU: Denies dysuria, urgency, frequency of  urination, hematuria Neuro: Denies stroke, seizures, syncope    Physical Exam: Blood pressure 113/78, pulse 64, temperature 99.1 F (37.3 C), temperature source Oral, resp. rate 20, height 5\' 3"  (1.6 m), weight 61.236 kg (135 lb), last menstrual period 06/05/2012, SpO2 98.00%. Constitutional:   Patient is a well-developed and well-nourished female  in no acute distress and cooperative with exam. Head: Normocephalic and atraumatic Mouth: Mucus membranes moist Eyes: PERRL, EOMI, conjunctivae normal Neck: Supple, No Thyromegaly Cardiovascular: RRR, S1 normal, S2 normal Pulmonary/Chest: CTAB, no wheezes, rales, or rhonchi Abdominal: Soft. Non-tender, non-distended, bowel sounds are normal, no masses, organomegaly, or guarding present.  Neurological: A&O x3, Strenght is normal and symmetric bilaterally, cranial nerve II-XII are grossly intact, no focal motor deficit, sensory intact to light touch bilaterally.  Extremities : No Cyanosis, Clubbing or Edema   Labs on Admission:  Results for orders placed during the hospital encounter of 07/06/12 (from the past 48 hour(s))  CBC WITH DIFFERENTIAL     Status: Abnormal   Collection Time    07/06/12  8:22 PM      Result Value Range   WBC 4.4  4.0 - 10.5 K/uL   RBC 4.25  3.87 - 5.11 MIL/uL   Hemoglobin 6.6 (*) 12.0 - 15.0 g/dL   Comment: RESULT REPEATED AND VERIFIED     CRITICAL RESULT CALLED TO, READ BACK BY AND VERIFIED WITH:     RICE J AT 2128 ON [id number] BY FORSYTH K   HCT 23.8 (*)  36.0 - 46.0 %   MCV 56.0 (*) 78.0 - 100.0 fL   MCH 15.5 (*) 26.0 - 34.0 pg   MCHC 27.7 (*) 30.0 - 36.0 g/dL   RDW 45.4 (*) 09.8 - 11.9 %   Platelets 151  150 - 400 K/uL   Neutrophils Relative % 34 (*) 43 - 77 %   Lymphocytes Relative 55 (*) 12 - 46 %   Monocytes Relative 9  3 - 12 %   Eosinophils Relative 1  0 - 5 %   Basophils Relative 1  0 - 1 %   Neutro Abs 1.5 (*) 1.7 - 7.7 K/uL   Lymphs Abs 2.5  0.7 - 4.0 K/uL   Monocytes Absolute 0.4  0.1 - 1.0 K/uL    Eosinophils Absolute 0.0  0.0 - 0.7 K/uL   Basophils Absolute 0.0  0.0 - 0.1 K/uL   RBC Morphology POLYCHROMASIA PRESENT     Comment: ELLIPTOCYTES   Smear Review PLATELET COUNT CONFIRMED BY SMEAR     Comment: LARGE PLATELETS PRESENT  COMPREHENSIVE METABOLIC PANEL     Status: Abnormal   Collection Time    07/06/12  8:22 PM      Result Value Range   Sodium 141  135 - 145 mEq/L   Potassium 3.5  3.5 - 5.1 mEq/L   Chloride 105  96 - 112 mEq/L   CO2 25  19 - 32 mEq/L   Glucose, Bld 117 (*) 70 - 99 mg/dL   BUN 6  6 - 23 mg/dL   Creatinine, Ser 1.47  0.50 - 1.10 mg/dL   Calcium 8.9  8.4 - 82.9 mg/dL   Total Protein 7.5  6.0 - 8.3 g/dL   Albumin 3.8  3.5 - 5.2 g/dL   AST 14  0 - 37 U/L   ALT 5  0 - 35 U/L   Alkaline Phosphatase 53  39 - 117 U/L   Total Bilirubin 0.2 (*) 0.3 - 1.2 mg/dL   GFR calc non Af Amer 74 (*) >90 mL/min   GFR calc Af Amer 85 (*) >90 mL/min   Comment:            The eGFR has been calculated     using the CKD EPI equation.     This calculation has not been     validated in all clinical     situations.     eGFR's persistently     <90 mL/min signify     possible Chronic Kidney Disease.  LIPASE, BLOOD     Status: None   Collection Time    07/06/12  8:22 PM      Result Value Range   Lipase 43  11 - 59 U/L  URINALYSIS, ROUTINE W REFLEX MICROSCOPIC     Status: Abnormal   Collection Time    07/06/12  8:30 PM      Result Value Range   Color, Urine YELLOW  YELLOW   APPearance CLEAR  CLEAR   Specific Gravity, Urine <1.005 (*) 1.005 - 1.030   pH 6.5  5.0 - 8.0   Glucose, UA NEGATIVE  NEGATIVE mg/dL   Hgb urine dipstick SMALL (*) NEGATIVE   Bilirubin Urine NEGATIVE  NEGATIVE   Ketones, ur NEGATIVE  NEGATIVE mg/dL   Protein, ur NEGATIVE  NEGATIVE mg/dL   Urobilinogen, UA 0.2  0.0 - 1.0 mg/dL   Nitrite NEGATIVE  NEGATIVE   Leukocytes, UA NEGATIVE  NEGATIVE  URINE MICROSCOPIC-ADD ON  Status: Abnormal   Collection Time    07/06/12  8:30 PM      Result  Value Range   Squamous Epithelial / LPF FEW (*) RARE   WBC, UA 0-2  <3 WBC/hpf   RBC / HPF 0-2  <3 RBC/hpf   Bacteria, UA RARE  RARE  PREGNANCY, URINE     Status: None   Collection Time    07/06/12  8:43 PM      Result Value Range   Preg Test, Ur NEGATIVE  NEGATIVE   Comment:            THE SENSITIVITY OF THIS     METHODOLOGY IS >20 mIU/mL.  PREPARE RBC (CROSSMATCH)     Status: None   Collection Time    07/06/12  9:34 PM      Result Value Range   Order Confirmation ORDER PROCESSED BY BLOOD BANK    TYPE AND SCREEN     Status: None   Collection Time    07/06/12  9:37 PM      Result Value Range   ABO/RH(D) O POS     Antibody Screen PENDING     Sample Expiration 07/09/2012      Radiological Exams on Admission: Ct Abdomen Pelvis W Contrast  07/06/2012   *RADIOLOGY REPORT*  Clinical Data: Abdominal pain, weakness and fever.  CT ABDOMEN AND PELVIS WITH CONTRAST  Technique:  Multidetector CT imaging of the abdomen and pelvis was performed following the standard protocol during bolus administration of intravenous contrast.  Contrast: 50mL OMNIPAQUE IOHEXOL 300 MG/ML SOLN, OMNIPAQUE IOHEXOL 300 MG/ML  SOLN  Comparison: 11/06/2010  Findings: The liver, spleen, pancreas, adrenal glands and kidneys are within normal limits.  There is stable appearance of the gallbladder with suggestion of an area of focally increased attenuation adjacent to or within the tip of the gallbladder.   No evidence of gallbladder distention, inflammation or biliary ductal dilatation.  Bowel loops are normal in caliber and show no evidence of inflammation or obstruction.  The appendix is visualized and normal.  No free fluid or abnormal fluid collections.  Similar appearance of the uterus and adnexal regions with follicular cysts identified on the left.  The bladder is unremarkable.  No hernias are identified.  No masses or enlarged lymph nodes are seen.  Bony structures are unremarkable.  IMPRESSION: No acute  findings.  Stable appearance by CT of an area of increased attenuation located adjacent to the tip of the gallbladder and left- sided adnexal cysts.   Original Report Authenticated By: Irish Lack, M.D.    Assessment/Plan Active Problems:   Abnormal uterine bleeding   Iron deficiency   Anemia  Anemia Patient has a history of abnormal uterine bleeding with irregular periods We'll also obtain stool guaiac to rule out GI bleed We'll transfuse 2 units of blood  Iron deficiency We'll obtain anemia panel Start the patient on ferrous gluconate if found to be severely iron deficient  Abdominal pain CT abdomen is negative Patient did have diarrhea Could be due to viral gastroenteritis  Code status: Full code  Family discussion: Discussed with patient in detail   Time Spent on Admission: 55 min  Sanii Kukla S [facility] Pager: [phone] 07/06/2012, 11:18 PM

## 2012-07-07 ENCOUNTER — Encounter (HOSPITAL_COMMUNITY): Payer: Self-pay | Admitting: Cardiology

## 2012-07-07 DIAGNOSIS — N926 Irregular menstruation, unspecified: Secondary | ICD-10-CM

## 2012-07-07 LAB — COMPREHENSIVE METABOLIC PANEL
Albumin: 3 g/dL — ABNORMAL LOW (ref 3.5–5.2)
Alkaline Phosphatase: 45 U/L (ref 39–117)
BUN: 8 mg/dL (ref 6–23)
CO2: 24 mEq/L (ref 19–32)
Chloride: 107 mEq/L (ref 96–112)
Glucose, Bld: 95 mg/dL (ref 70–99)
Potassium: 3.3 mEq/L — ABNORMAL LOW (ref 3.5–5.1)
Total Bilirubin: 0.7 mg/dL (ref 0.3–1.2)

## 2012-07-07 LAB — CBC
HCT: 26 % — ABNORMAL LOW (ref 36.0–46.0)
Hemoglobin: 7.7 g/dL — ABNORMAL LOW (ref 12.0–15.0)
RBC: 4.29 MIL/uL (ref 3.87–5.11)
RDW: 24.4 % — ABNORMAL HIGH (ref 11.5–15.5)
WBC: 4.6 10*3/uL (ref 4.0–10.5)

## 2012-07-07 LAB — IRON AND TIBC: Iron: <10 ug/dL — ABNORMAL LOW (ref 42–135)

## 2012-07-07 LAB — FOLATE: Folate: 15.7 ng/mL

## 2012-07-07 LAB — VITAMIN B12: Vitamin B-12: 751 pg/mL (ref 211–911)

## 2012-07-07 MED ORDER — ONDANSETRON HCL 4 MG/2ML IJ SOLN: 4.0000 mg | Freq: Four times a day (QID) | INTRAMUSCULAR | Status: DC | PRN

## 2012-07-07 MED ORDER — SODIUM CHLORIDE 0.9 % IJ SOLN
3.0000 mL | Freq: Two times a day (BID) | INTRAMUSCULAR | Status: DC
  Administered 2012-07-07: 3 mL via INTRAVENOUS

## 2012-07-07 MED ORDER — FERROUS SULFATE 325 (65 FE) MG PO TABS: 325.0000 mg | ORAL_TABLET | Freq: Three times a day (TID) | ORAL | Status: DC

## 2012-07-07 MED ORDER — FERROUS SULFATE 325 (65 FE) MG PO TABS
325.0000 mg | ORAL_TABLET | Freq: Three times a day (TID) | ORAL | Status: DC
  Administered 2012-07-07: 325 mg via ORAL
  Filled 2012-07-07: qty 1

## 2012-07-07 MED ORDER — ONDANSETRON HCL 4 MG PO TABS: 4.0000 mg | ORAL_TABLET | Freq: Four times a day (QID) | ORAL | Status: DC | PRN

## 2012-07-07 MED ORDER — SODIUM CHLORIDE 0.9 % IJ SOLN: 3.0000 mL | INTRAMUSCULAR | Status: DC | PRN

## 2012-07-07 MED ORDER — FERROUS FUMARATE 325 (106 FE) MG PO TABS: 1.0000 | ORAL_TABLET | Freq: Two times a day (BID) | ORAL | Status: DC

## 2012-07-07 MED ORDER — CALCIUM CARBONATE ANTACID 500 MG PO CHEW
1.0000 | CHEWABLE_TABLET | Freq: Four times a day (QID) | ORAL | Status: DC | PRN
  Filled 2012-07-07: qty 1

## 2012-07-07 MED ORDER — HYDROCODONE-ACETAMINOPHEN 10-325 MG PO TABS
1.0000 | ORAL_TABLET | ORAL | Status: DC | PRN
  Administered 2012-07-07: 1 via ORAL
  Filled 2012-07-07: qty 1

## 2012-07-07 MED ORDER — POTASSIUM CHLORIDE CRYS ER 20 MEQ PO TBCR
40.0000 meq | EXTENDED_RELEASE_TABLET | Freq: Once | ORAL | Status: AC
  Administered 2012-07-07: 40 meq via ORAL
  Filled 2012-07-07: qty 2

## 2012-07-07 MED ORDER — PANTOPRAZOLE SODIUM 40 MG IV SOLR
40.0000 mg | INTRAVENOUS | Status: DC
  Filled 2012-07-07: qty 40

## 2012-07-07 MED ORDER — ACETAMINOPHEN 650 MG RE SUPP: 650.0000 mg | Freq: Four times a day (QID) | RECTAL | Status: DC | PRN

## 2012-07-07 MED ORDER — ACETAMINOPHEN 325 MG PO TABS
650.0000 mg | ORAL_TABLET | Freq: Four times a day (QID) | ORAL | Status: DC | PRN
  Filled 2012-07-07: qty 2

## 2012-07-07 MED ORDER — NICOTINE 14 MG/24HR TD PT24
14.0000 mg | MEDICATED_PATCH | Freq: Every day | TRANSDERMAL | Status: DC
  Administered 2012-07-07: 14 mg via TRANSDERMAL
  Filled 2012-07-07: qty 1

## 2012-07-07 MED ORDER — PANTOPRAZOLE SODIUM 40 MG PO TBEC
40.0000 mg | DELAYED_RELEASE_TABLET | Freq: Every day | ORAL | Status: DC
  Administered 2012-07-07: 40 mg via ORAL
  Filled 2012-07-07: qty 1

## 2012-07-07 MED ORDER — SODIUM CHLORIDE 0.9 % IV SOLN: 250.0000 mL | INTRAVENOUS | Status: DC | PRN

## 2012-07-07 NOTE — Progress Notes (Signed)
During downtime 2 units of RBC was transfused. First unit given at midnight and second has been started at 3:30. Transfusions will be charted afterwards for documentation. Units verified according to protocol by second RN at bedside. No transfusion reaction noted. Will continue to monitor.

## 2012-07-07 NOTE — Progress Notes (Signed)
UR chart review completed.  

## 2012-07-07 NOTE — Progress Notes (Signed)
Patient discharged home, family friend here to pick up.  Follow up appointment with health dept in place.  Instructed to start taking iron supplements daily with all meals.  Pt advised to use stool softeners as iron may lead to constipation.  Noted that stools may be dark in color, but instructed to call MD if any bright red blood in stool.  IV removed - WNL.  Pt has no questions at this time.  Verbalizes understanding of instructions. Stable to DC.  Patient left floor via WC with NT

## 2012-07-07 NOTE — Discharge Summary (Signed)
Physician Discharge Summary  [name] ZOX:[id number] DOB: [birth year] DOA: 07/06/2012  PCP: No PCP Per Patient will establish with [facility] Department  Admit date: 07/06/2012 Discharge date: 07/07/2012  Recommendations for Outpatient Follow-up:  1. Followup presumed iron deficiency anemia, follow CBC as clinically indicated, assess response to iron as clinically indicated 2. Dysfunctional uterine bleeding, consider GYN referral 3. Family history of colon cancer, suggest outpatient referral to gastroenterology for screening colonoscopy 4. Continue to encourage smoking cessation   Follow-up Information   Follow up with [facility] On 07/15/2012. (at 10:00)    Contact information:   [address] [place] [address] [phone]      Discharge Diagnoses:  1. Marked iron deficiency anemia, symptomatic 2. Dysfunctional uterine bleeding, chronic 3. Generalized weakness, resolved  Discharge Condition: Improved Disposition: Home  Diet recommendation: Regular  Filed Weights   07/06/12 2017  Weight: 61.236 kg (135 lb)    History of present illness:  47 year old woman with history of abnormal uterine bleeding and anemia requiring blood transfusions came to the emergency department complaining of generalized weakness and abdominal pain. CT of the abdomen and pelvis was unremarkable. Patient was found to be anemic and admitted for blood transfusion.  Hospital Course:  [name] was admitted and transfused 2 units packed red blood cells with improvement in hemoglobin and resolution of subjective complaints/symptoms. She has a history of dysfunctional uterine bleeding which has been chronic for some time now. Given improvement in hemoglobin, clinical stability and chronicity of bleeding, now stable for discharge with close outpatient followup which has been arranged. Of note she was last admitted 2012 at that time had an EGD which was unremarkable but  patient left AMA prior to having a colonoscopy. She does have a family history and outpatient referred to gastroenterology should be encouraged.  1. Marked iron deficiency anemia: Improved status post transfusion. Asymptomatic. Secondary to dysfunctional uterine bleeding. Will need to followup as an outpatient  2. Dysfunctional uterine bleeding: With associated abdominal pain. Start iron. She has followup in one week with health department. From there she can be referred to GYN as needed.  3. Generalized weakness: Secondary to anemia. Now resolved status post transfusion.  4. Family history of colon cancer: Followup as an outpatient. Consider lower endoscopy.  Consultants: None   Procedures:  Transfusion 2 units packed red blood cells    Discharge Instructions  Discharge Orders   Future Orders Complete By Expires     Activity as tolerated - No restrictions  As directed     Diet general  As directed     Discharge instructions  As directed     Comments:      Start iron to treat your anemia. Iron will cause stools to turn black and may cause constipation. You may need a stool softener. Take iron with food. Seek immediate medical attention for worsening bleeding, generalized weakness or change in your condition. Please stop smoking.        Medication List    TAKE these medications       ferrous sulfate 325 (65 FE) MG tablet  Take 1 tablet (325 mg total) by mouth 3 (three) times daily with meals.       No Known Allergies  The results of significant diagnostics from this hospitalization (including imaging, microbiology, ancillary and laboratory) are listed below for reference.    Significant Diagnostic Studies: Ct Abdomen Pelvis W Contrast  07/06/2012   *RADIOLOGY REPORT*  Clinical Data:  Abdominal pain, weakness and fever.  CT ABDOMEN AND PELVIS WITH CONTRAST  Technique:  Multidetector CT imaging of the abdomen and pelvis was performed following the standard protocol during bolus  administration of intravenous contrast.  Contrast: 50mL OMNIPAQUE IOHEXOL 300 MG/ML SOLN, OMNIPAQUE IOHEXOL 300 MG/ML  SOLN  Comparison: 11/06/2010  Findings: The liver, spleen, pancreas, adrenal glands and kidneys are within normal limits.  There is stable appearance of the gallbladder with suggestion of an area of focally increased attenuation adjacent to or within the tip of the gallbladder.   No evidence of gallbladder distention, inflammation or biliary ductal dilatation.  Bowel loops are normal in caliber and show no evidence of inflammation or obstruction.  The appendix is visualized and normal.  No free fluid or abnormal fluid collections.  Similar appearance of the uterus and adnexal regions with follicular cysts identified on the left.  The bladder is unremarkable.  No hernias are identified.  No masses or enlarged lymph nodes are seen.  Bony structures are unremarkable.  IMPRESSION: No acute findings. Stable appearance by CT of an area of increased attenuation located adjacent to the tip of the gallbladder and left- sided adnexal cysts.   Original Report Authenticated By: Irish Lack, M.D.    Labs: Basic Metabolic Panel:  Recent Labs Lab 07/06/12 2022 07/07/12 0828  NA 141 141  K 3.5 3.3*  CL 105 107  CO2 25 24  GLUCOSE 117* 95  BUN 6 8  CREATININE 0.92 0.81  CALCIUM 8.9 8.4   Liver Function Tests:  Recent Labs Lab 07/06/12 2022 07/07/12 0828  AST 14 42*  ALT 5 27  ALKPHOS 53 45  BILITOT 0.2* 0.7  PROT 7.5 6.1  ALBUMIN 3.8 3.0*    Recent Labs Lab 07/06/12 2022  LIPASE 43   CBC:  Recent Labs Lab 07/06/12 2022 07/07/12 0828  WBC 4.4 4.6  NEUTROABS 1.5*  --   HGB 6.6* 7.7*  HCT 23.8* 26.0*  MCV 56.0* 60.6*  PLT 151 129*    Active Problems:   Abnormal uterine bleeding   Iron deficiency   Anemia   Time coordinating discharge: 25 minutes  Signed:  Brendia Sacks, MD [facility] 07/07/2012, 11:22 AM

## 2012-07-07 NOTE — Care Management Note (Signed)
    Page 1 of 1   07/07/2012     11:27:33 AM   CARE MANAGEMENT NOTE 07/07/2012  Patient:  WAYNESHA, RAMMEL   Account Number:  [id number]  Date Initiated:  07/07/2012  Documentation initiated by:  Sharrie Rothman  Subjective/Objective Assessment:   Pt admitted from home with anemia. 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 Pt is independent with ADL's.     Action/Plan:   Pt has no insurance so finanical counselor is aware. Pt has outpt followup at [facility] and documented on AVs. Pt is also aware.   Anticipated DC Date:  07/07/2012   Anticipated DC Plan:  HOME/SELF CARE  In-house referral  Financial Counselor      DC Planning Services  CM consult      Choice offered to / List presented to:             Status of service:  Completed, signed off Medicare Important Message given?   (If response is "NO", the following Medicare IM given date fields will be blank) Date Medicare IM given:   Date Additional Medicare IM given:    Discharge Disposition:  HOME/SELF CARE  Per UR Regulation:    If discussed at Long Length of Stay Meetings, dates discussed:    Comments:  07/07/12 1126 Arlyss Queen, RN BSN CM

## 2012-07-07 NOTE — Progress Notes (Signed)
[facility] PROGRESS NOTE  [name] ZOX:[id number] DOB: 06-30-1965 DOA: 07/06/2012 PCP: No PCP Per Patient  Assessment/Plan: 1. Marked iron deficiency anemia: Improved status post transfusion. Asymptomatic. Secondary to dysfunctional uterine bleeding. Will need to followup as an outpatient  2. Dysfunctional uterine bleeding: With associated abdominal pain. Start iron. She is followup in one week with health department. From there she can be referred to GYN as needed.  3. Generalized weakness: Secondary to anemia. Now resolved status post transfusion.  4. Family history of colon cancer: Followup as an outpatient. Consider lower endoscopy.   Replete potassium  Stop naproxen  Start iron  Keep followup with health department in one week  Stop smoking  Code Status: Full code  Family Communication: None present  Disposition Plan: Home  Brendia Sacks, MD  [facility]  Pager [phone] If 7PM-7AM, please contact night-coverage at [email or web address], password [facility] 07/07/2012, 10:37 AM  LOS: 1 day   Clinical Summary: 47 year old woman with history of abnormal uterine bleeding and anemia requiring blood transfusions came to the emergency department complaining of generalized weakness and abdominal pain. CT of the abdomen and pelvis was unremarkable. Patient was found to be anemic and admitted for blood transfusion.  Consultants:    Procedures:  Transfusion 2 units packed red blood cells  HPI/Subjective: Afebrile, stable vital signs. Feels much better. No weakness, some abdominal cramping from menses. Wants to go home.  Objective: Filed Vitals:   07/07/12 0345 07/07/12 0430 07/07/12 0530 07/07/12 0630  BP: 108/69 102/68 109/68 122/78  Pulse: 69 68 69 59  Temp: 98.3 F (36.8 C) 98.2 F (36.8 C) 98.4 F (36.9 C) 98.3 F (36.8 C)  TempSrc: Oral Oral Oral Oral  Resp: 20 20 20 20   Height:      Weight:      SpO2:  98%      Intake/Output Summary (Last 24  hours) at 07/07/12 1037 Last data filed at 07/07/12 0330  Gross per 24 hour  Intake    700 ml  Output      0 ml  Net    700 ml     Filed Weights   07/06/12 2017  Weight: 61.236 kg (135 lb)    Exam:  General: Appears calm and comfortable; well-appearing. Cardiovascular: RRR, no m/r/g. No LE edema. Respiratory: CTA bilaterally, no w/r/r. Normal respiratory effort. Psychiatric: grossly normal mood and affect, speech fluent and appropriate  Data Reviewed:  Hemoglobin improved to 7.7. Potassium 3.3. CT of the abdomen and pelvis no acute findings.   Pending studies:  Anemia panel   Scheduled Meds: . nicotine  14 mg Transdermal Daily  . pantoprazole  40 mg Oral Daily  . sodium chloride  3 mL Intravenous Q12H   Continuous Infusions:   Active Problems:   Abnormal uterine bleeding   Iron deficiency   Anemia

## 2012-07-08 LAB — TYPE AND SCREEN
ABO/RH(D): O POS
Unit division: 0

## 2012-09-09 ENCOUNTER — Other Ambulatory Visit (HOSPITAL_COMMUNITY): Payer: Self-pay | Admitting: Nurse Practitioner

## 2012-09-09 DIAGNOSIS — Z139 Encounter for screening, unspecified: Secondary | ICD-10-CM

## 2012-09-14 ENCOUNTER — Ambulatory Visit (HOSPITAL_COMMUNITY)
Admission: RE | Admit: 2012-09-14 | Discharge: 2012-09-14 | Disposition: A | Discharge status: Home / Self Care | Payer: Self-pay | Source: Ambulatory Visit | Attending: Family Medicine | Admitting: Family Medicine

## 2012-09-14 DIAGNOSIS — Z139 Encounter for screening, unspecified: Secondary | ICD-10-CM

## 2012-09-15 ENCOUNTER — Other Ambulatory Visit: Payer: Self-pay | Admitting: Family Medicine

## 2012-09-15 DIAGNOSIS — R928 Other abnormal and inconclusive findings on diagnostic imaging of breast: Secondary | ICD-10-CM

## 2012-09-22 ENCOUNTER — Encounter: Payer: Self-pay | Admitting: Obstetrics & Gynecology

## 2012-09-30 ENCOUNTER — Emergency Department (HOSPITAL_COMMUNITY): Payer: Self-pay

## 2012-09-30 ENCOUNTER — Encounter (HOSPITAL_COMMUNITY): Payer: Self-pay | Admitting: Emergency Medicine

## 2012-09-30 ENCOUNTER — Emergency Department (HOSPITAL_COMMUNITY)
Admission: EM | Admit: 2012-09-30 | Discharge: 2012-09-30 | Disposition: A | Discharge status: Home / Self Care | Payer: Self-pay | Attending: Emergency Medicine | Admitting: Emergency Medicine

## 2012-09-30 DIAGNOSIS — R071 Chest pain on breathing: Secondary | ICD-10-CM | POA: Insufficient documentation

## 2012-09-30 DIAGNOSIS — Z791 Long term (current) use of non-steroidal anti-inflammatories (NSAID): Secondary | ICD-10-CM | POA: Insufficient documentation

## 2012-09-30 DIAGNOSIS — K219 Gastro-esophageal reflux disease without esophagitis: Secondary | ICD-10-CM | POA: Insufficient documentation

## 2012-09-30 DIAGNOSIS — J4489 Other specified chronic obstructive pulmonary disease: Secondary | ICD-10-CM | POA: Insufficient documentation

## 2012-09-30 DIAGNOSIS — J449 Chronic obstructive pulmonary disease, unspecified: Secondary | ICD-10-CM | POA: Insufficient documentation

## 2012-09-30 DIAGNOSIS — R0781 Pleurodynia: Secondary | ICD-10-CM

## 2012-09-30 DIAGNOSIS — F17200 Nicotine dependence, unspecified, uncomplicated: Secondary | ICD-10-CM | POA: Insufficient documentation

## 2012-09-30 DIAGNOSIS — D649 Anemia, unspecified: Secondary | ICD-10-CM | POA: Insufficient documentation

## 2012-09-30 DIAGNOSIS — Z79899 Other long term (current) drug therapy: Secondary | ICD-10-CM | POA: Insufficient documentation

## 2012-09-30 HISTORY — DX: Gastro-esophageal reflux disease without esophagitis: K21.9

## 2012-09-30 LAB — BASIC METABOLIC PANEL
Calcium: 9.1 mg/dL (ref 8.4–10.5)
Creatinine, Ser: 0.85 mg/dL (ref 0.50–1.10)
GFR calc Af Amer: >90 mL/min (ref >90)

## 2012-09-30 LAB — D-DIMER, QUANTITATIVE: D-Dimer, Quant: 0.49 ug/mL-FEU — ABNORMAL HIGH (ref 0.00–0.48)

## 2012-09-30 LAB — CBC WITH DIFFERENTIAL/PLATELET
Basophils Absolute: 0 10*3/uL (ref 0.0–0.1)
Basophils Relative: 1 % (ref 0–1)
Eosinophils Relative: 2 % (ref 0–5)
HCT: 38.5 % (ref 36.0–46.0)
MCHC: 32.2 g/dL (ref 30.0–36.0)
Monocytes Absolute: 0.5 10*3/uL (ref 0.1–1.0)
Neutro Abs: 3.4 10*3/uL (ref 1.7–7.7)
RDW: 17.7 % — ABNORMAL HIGH (ref 11.5–15.5)

## 2012-09-30 LAB — TROPONIN I: Troponin I: <0.3 ng/mL (ref <0.30)

## 2012-09-30 MED ORDER — NAPROXEN 500 MG PO TABS: 500.0000 mg | ORAL_TABLET | Freq: Two times a day (BID) | ORAL | Status: DC

## 2012-09-30 NOTE — ED Provider Notes (Signed)
CSN: [id number]     Arrival date & time 09/30/12  1248 History   First MD Initiated Contact with Patient 09/30/12 1328     Chief Complaint  Patient presents with  . Chest Pain   (Consider location/radiation/quality/duration/timing/severity/associated sxs/prior Treatment) HPI Comments: 47 year old female with a history of COPD, pleurisy and acid reflux disease. She was recently diagnosed with anemia, required a blood transfusion but was told this was because of heavy vaginal bleeding. She is currently undergoing consultation with gynecology on how to manage this. She states that over the last 2 weeks she has had intermittent left-sided sharp chest pain which lasts for several seconds, comes on spontaneously and is not related to exertion, deep breathing, position or eating. This started this morning again when she was laying in bed watching TV, felt several seconds of sharp and shooting pains in her left chest radiating towards her left shoulder. It resolved after several seconds, it has recurred several times today. She denies any acute swelling in her lower extremities, any injuries, trauma, travel, immobilization. She does not take oral contraceptive pills, she thinks that she may have breast cancer and is currently being evaluated at this by mammogram and specialty consultation. There is no family history of venous thromboembolism, she has no difficulty with exertion. He has no prior history of workup for cardiovascular disease. She does smoke cigarettes daily.  Patient is a 47 y.o. female presenting with chest pain. The history is provided by the patient.  Chest Pain   Past Medical History  Diagnosis Date  . COPD (chronic obstructive pulmonary disease)   . Anemia   . Pleurisy   . GERD (gastroesophageal reflux disease)    Past Surgical History  Procedure Laterality Date  . Tubal ligation    . Cyst removed from right face    . Esophagogastroduodenoscopy  11/07/2010    Procedure:  ESOPHAGOGASTRODUODENOSCOPY (EGD);  Surgeon: Corbin Ade, MD;  Location: [facility];  Service: Endoscopy;  Laterality: N/A;   Family History  Problem Relation Age of Onset  . Colon cancer Father 17    deceased secondary to kidney failure  . Colon cancer Brother 48    recent diagnosis (2012). Finishing chemotherapy.   History  Substance Use Topics  . Smoking status: Current Every Day Smoker -- 0.50 packs/day for 20 years    Types: Cigarettes  . Smokeless tobacco: Never Used  . Alcohol Use: No   OB History   Grav Para Term Preterm Abortions TAB SAB Ect Mult Living   2 2 2       2      Review of Systems  Cardiovascular: Positive for chest pain.  All other systems reviewed and are negative.    Allergies  Review of patient's allergies indicates no known allergies.  Home Medications   Current Outpatient Rx  Name  Route  Sig  Dispense  Refill  . omeprazole (PRILOSEC) 20 MG capsule   Oral   Take 20 mg by mouth daily.         . ferrous sulfate 325 (65 FE) MG tablet   Oral   Take 1 tablet (325 mg total) by mouth 3 (three) times daily with meals.   90 tablet   0   . naproxen (NAPROSYN) 500 MG tablet   Oral   Take 1 tablet (500 mg total) by mouth 2 (two) times daily with a meal.   30 tablet   0    BP 129/89  Pulse 79  Temp(Src) 98.3 F (36.8 C) (Oral)  Resp 18  Ht 5\' 3"  (1.6 m)  Wt 131 lb (59.421 kg)  BMI 23.21 kg/m2  SpO2 95%  LMP 09/29/2012 Physical Exam  Nursing note and vitals reviewed. Constitutional: She appears well-developed and well-nourished. No distress.  HENT:  Head: Normocephalic and atraumatic.  Mouth/Throat: Oropharynx is clear and moist. No oropharyngeal exudate.  Eyes: Conjunctivae and EOM are normal. Pupils are equal, round, and reactive to light. Right eye exhibits no discharge. Left eye exhibits no discharge. No scleral icterus.  Neck: Normal range of motion. Neck supple. No JVD present. No thyromegaly present.  Cardiovascular: Normal  rate, regular rhythm, normal heart sounds and intact distal pulses.  Exam reveals no gallop and no friction rub.   No murmur heard. Pulmonary/Chest: Effort normal and breath sounds normal. No respiratory distress. She has no wheezes. She has no rales.  Abdominal: Soft. Bowel sounds are normal. She exhibits no distension and no mass. There is no tenderness.  Musculoskeletal: Normal range of motion. She exhibits no edema and no tenderness.  Lymphadenopathy:    She has no cervical adenopathy.  Neurological: She is alert. Coordination normal.  Skin: Skin is warm and dry. No rash noted. No erythema.  Psychiatric: She has a normal mood and affect. Her behavior is normal.    ED Course  Procedures (including critical care time) Labs Review Labs Reviewed  CBC WITH DIFFERENTIAL - Abnormal; Notable for the following:    MCV 76.5 (*)    MCH 24.7 (*)    RDW 17.7 (*)    All other components within normal limits  BASIC METABOLIC PANEL - Abnormal; Notable for the following:    GFR calc non Af Amer 80 (*)    All other components within normal limits  D-DIMER, QUANTITATIVE - Abnormal; Notable for the following:    D-Dimer, Quant 0.49 (*)    All other components within normal limits  TROPONIN I   Imaging Review Dg Chest 2 View  09/30/2012   CLINICAL DATA:  Chest pain. Pleurisy.  EXAM: CHEST  2 VIEW  COMPARISON:  01/20/2009  FINDINGS: The heart size and mediastinal contours are within normal limits. Both lungs are clear. The visualized skeletal structures are unremarkable.  IMPRESSION: No active cardiopulmonary disease.   Electronically Signed   By: Myles Rosenthal   On: 09/30/2012 13:55    MDM   1. Pleuritic chest pain    The patient has no reproducible tenderness to palpation over her chest, she has normal breath sounds, normal lung sounds, normal heart sounds and no peripheral edema. I would consider the patient to be low risk for pulmonary embolism that not no risk mass I will order a d-dimer to  rule out the pulmonary embolism. The nature of her symptoms is more consistent with a pleuritic diagnosis however as a smoker she is at risk for pneumothorax as well as pulmonary embolism. Her EKG is normal, there is no ischemia, vital signs are normal, the patient appears stable on the initial evaluation. She did take aspirin prior to EMS arrival.   ED ECG REPORT  I personally interpreted this EKG   Date: 09/30/2012   Rate: 66  Rhythm: normal sinus rhythm  QRS Axis: normal  Intervals: normal  ST/T Wave abnormalities: normal  Conduction Disutrbances:none  Narrative Interpretation:   Old EKG Reviewed: Compared with 01/20/2009, rate is now slower    Pulse 66 on my exam, pt has no other s/s of PE - has  D dimer which is essentially normal, V S otherwise unremarkable - pt counseled to stop smoking, in agreement, actively f/u for her bleeding and her breast masses.  Meds given in ED:  Medications - No data to display  New Prescriptions   NAPROXEN (NAPROSYN) 500 MG TABLET    Take 1 tablet (500 mg total) by mouth 2 (two) times daily with a meal.      Vida Roller, MD 09/30/12 1408

## 2012-09-30 NOTE — ED Notes (Addendum)
Per EMS, pt reported chest pain x2 weeks with increased pain today. Pt reports nausea this am but denies at this time. Pt alert and oriented. Airway patent. No diaphoresis noted. EMS admin 1 nitro releiving pain until arrival to ED. Pt took four baby aspirin prior to EMS arrival. CBG en route 102.

## 2012-10-04 ENCOUNTER — Encounter: Payer: Self-pay | Admitting: Obstetrics & Gynecology

## 2012-10-04 ENCOUNTER — Other Ambulatory Visit (HOSPITAL_COMMUNITY): Payer: Self-pay | Admitting: Family Medicine

## 2012-10-04 ENCOUNTER — Ambulatory Visit (INDEPENDENT_AMBULATORY_CARE_PROVIDER_SITE_OTHER): Payer: Self-pay | Admitting: Obstetrics & Gynecology

## 2012-10-04 VITALS — BP 128/70 | Ht 63.0 in | Wt 132.0 lb

## 2012-10-04 DIAGNOSIS — N926 Irregular menstruation, unspecified: Secondary | ICD-10-CM

## 2012-10-04 DIAGNOSIS — N939 Abnormal uterine and vaginal bleeding, unspecified: Secondary | ICD-10-CM

## 2012-10-04 DIAGNOSIS — R928 Other abnormal and inconclusive findings on diagnostic imaging of breast: Secondary | ICD-10-CM

## 2012-10-04 MED ORDER — MEGESTROL ACETATE 40 MG PO TABS: ORAL_TABLET | ORAL | Status: DC

## 2012-10-04 NOTE — Progress Notes (Signed)
Patient ID: [name], female   DOB: 29-Oct-1965, 47 y.o.   MRN: [id number] The patient is referred and from hard to say is from the health Department or the ER but in any event she is here because of very heavy vaginal bleeding with her periods Her periods last 7-8 days are incredibly heavy very pain large clot uses super duty pad leaks with towel She has had transfusion back in [place] she came in with a hemoglobin of 6.6 Her last hemoglobin was 12.4 which is excellent She had a CT scan in June which showed a normal uterus and adnexa  I am placing her on in her megestrol all 3 for 5 to for 5 and then one a day and see her back in one month for followup She is instructed that hopefully her periods will be very light to absent completely and hopefully we can maintain her on this until we get her to menopause  She has any trouble she'll let me in

## 2012-10-06 ENCOUNTER — Ambulatory Visit (HOSPITAL_COMMUNITY)
Admission: RE | Admit: 2012-10-06 | Discharge: 2012-10-06 | Disposition: A | Discharge status: Home / Self Care | Payer: PRIVATE HEALTH INSURANCE | Source: Ambulatory Visit | Attending: Family Medicine | Admitting: Family Medicine

## 2012-10-06 ENCOUNTER — Other Ambulatory Visit (HOSPITAL_COMMUNITY): Payer: Self-pay | Admitting: Family Medicine

## 2012-10-06 DIAGNOSIS — R928 Other abnormal and inconclusive findings on diagnostic imaging of breast: Secondary | ICD-10-CM | POA: Insufficient documentation

## 2012-11-03 ENCOUNTER — Ambulatory Visit (INDEPENDENT_AMBULATORY_CARE_PROVIDER_SITE_OTHER): Payer: Self-pay | Admitting: Obstetrics & Gynecology

## 2012-11-03 ENCOUNTER — Encounter: Payer: Self-pay | Admitting: Obstetrics & Gynecology

## 2012-11-03 VITALS — BP 130/76 | Wt 137.0 lb

## 2012-11-03 DIAGNOSIS — N920 Excessive and frequent menstruation with regular cycle: Secondary | ICD-10-CM

## 2012-11-03 DIAGNOSIS — N921 Excessive and frequent menstruation with irregular cycle: Secondary | ICD-10-CM

## 2012-11-03 DIAGNOSIS — D649 Anemia, unspecified: Secondary | ICD-10-CM

## 2012-11-03 NOTE — Progress Notes (Signed)
Patient ID: [name], female   DOB: 02-Mar-1965, 47 y.o.   MRN: [id number] Preoperative History and Physical  [name] is a 47 y.o. [id number] with Patient's last menstrual period was 11/03/2012. admitted for a hysteroscopy uterine curettage and endo ablation because of a history of heavy menstrual periods over the last 2 years, required transfusion x 2 6 units total.    PMH:    Past Medical History  Diagnosis Date  . COPD (chronic obstructive pulmonary disease)   . Anemia   . Pleurisy   . GERD (gastroesophageal reflux disease)     PSH:     Past Surgical History  Procedure Laterality Date  . Tubal ligation    . Cyst removed from right face    . Esophagogastroduodenoscopy  11/07/2010    Procedure: ESOPHAGOGASTRODUODENOSCOPY (EGD);  Surgeon: Corbin Ade, MD;  Location: [facility];  Service: Endoscopy;  Laterality: N/A;    POb/GynH:      OB History   Grav Para Term Preterm Abortions TAB SAB Ect Mult Living   2 2 2       2       SH:   History  Substance Use Topics  . Smoking status: Current Every Day Smoker -- 0.50 packs/day for 20 years    Types: Cigarettes  . Smokeless tobacco: Never Used  . Alcohol Use: No    FH:    Family History  Problem Relation Age of Onset  . Colon cancer Father 45    deceased secondary to kidney failure  . Colon cancer Brother 5    recent diagnosis (2012). Finishing chemotherapy.  . Breast cancer Maternal Aunt      Allergies: No Known Allergies  Medications:      Current outpatient prescriptions:ferrous sulfate 325 (65 FE) MG tablet, Take 1 tablet (325 mg total) by mouth 3 (three) times daily with meals., Disp: 90 tablet, Rfl: 0;  megestrol (MEGACE) 40 MG tablet, Take 3 tablets a day for 5 days, 2 tablets a day for 5 days then 1 a day until Dr Despina Hidden stops them, Disp: 45 tablet, Rfl: 6;  omeprazole (PRILOSEC) 20 MG capsule, Take 20 mg by mouth daily., Disp: , Rfl:  naproxen (NAPROSYN) 500 MG tablet, Take 1 tablet (500 mg  total) by mouth 2 (two) times daily with a meal., Disp: 30 tablet, Rfl: 0  Review of Systems:   Review of Systems  Constitutional: Negative for fever, chills, weight loss, malaise/fatigue and diaphoresis.  HENT: Negative for hearing loss, ear pain, nosebleeds, congestion, sore throat, neck pain, tinnitus and ear discharge.   Eyes: Negative for blurred vision, double vision, photophobia, pain, discharge and redness.  Respiratory: Negative for cough, hemoptysis, sputum production, shortness of breath, wheezing and stridor.   Cardiovascular: Negative for chest pain, palpitations, orthopnea, claudication, leg swelling and PND.  Gastrointestinal: Negative for abdominal pain. Negative for heartburn, nausea, vomiting, diarrhea, constipation, blood in stool and melena.  Genitourinary: Negative for dysuria, urgency, frequency, hematuria and flank pain.  Musculoskeletal: Negative for myalgias, back pain, joint pain and falls.  Skin: Negative for itching and rash.  Neurological: Negative for dizziness, tingling, tremors, sensory change, speech change, focal weakness, seizures, loss of consciousness, weakness and headaches.  Endo/Heme/Allergies: Negative for environmental allergies and polydipsia. Does not bruise/bleed easily.  Psychiatric/Behavioral: Negative for depression, suicidal ideas, hallucinations, memory loss and substance abuse. The patient is not nervous/anxious and does not have insomnia.      PHYSICAL EXAM:  Blood pressure 130/76,  weight 137 lb (62.143 kg), last menstrual period 11/03/2012.    Vitals reviewed. Constitutional: She is oriented to person, place, and time. She appears well-developed and well-nourished.  HENT:  Head: Normocephalic and atraumatic.  Right Ear: External ear normal.  Left Ear: External ear normal.  Nose: Nose normal.  Mouth/Throat: Oropharynx is clear and moist.  Eyes: Conjunctivae and EOM are normal. Pupils are equal, round, and reactive to light. Right  eye exhibits no discharge. Left eye exhibits no discharge. No scleral icterus.  Neck: Normal range of motion. Neck supple. No tracheal deviation present. No thyromegaly present.  Cardiovascular: Normal rate, regular rhythm, normal heart sounds and intact distal pulses.  Exam reveals no gallop and no friction rub.   No murmur heard. Respiratory: Effort normal and breath sounds normal. No respiratory distress. She has no wheezes. She has no rales. She exhibits no tenderness.  GI: Soft. Bowel sounds are normal. She exhibits no distension and no mass. There is tenderness. There is no rebound and no guarding.  Genitourinary:       Vulva is normal without lesions Vagina is pink moist without discharge Cervix normal in appearance and pap is normal Uterus is normal size shape and contour by sonogram Adnexa is negative with normal sized ovaries by sonogram  Musculoskeletal: Normal range of motion. She exhibits no edema and no tenderness.  Neurological: She is alert and oriented to person, place, and time. She has normal reflexes. She displays normal reflexes. No cranial nerve deficit. She exhibits normal muscle tone. Coordination normal.  Skin: Skin is warm and dry. No rash noted. No erythema. No pallor.  Psychiatric: She has a normal mood and affect. Her behavior is normal. Judgment and thought content normal.    Labs: Results for orders placed in visit on 11/03/12 (from the past 336 hour(s))  POCT HEMOGLOBIN   Collection Time    11/03/12  2:55 PM      Result Value Range   Hemoglobin 12.1 (*) 12.2 - 16.2 g/dL    EKG: Orders placed during the hospital encounter of 09/30/12  . ED EKG  . ED EKG  . EKG 12-LEAD  . EKG 12-LEAD  . EKG  . EKG    Imaging Studies: US Breast Left  10/06/2012   *RADIOLOGY REPORT*  Clinical Data:  The patient was recalled from screening for bilateral breast calcifications and asymmetry within the left axillary region on the left MLO view.  DIGITAL DIAGNOSTIC  BILATERAL MAMMOGRAM WITH CAD AND LEFT BREAST ULTRASOUND:  Comparison:  09/14/2012, baseline  Findings:  ACR Breast Density Category c:  The breast tissue is heterogeneously dense, which may obscure small masses.  Two separate groups of calcifications within the upper-outer quadrant of the right breast were evaluated with spot compression magnification views.  The majority of these calcifications appear to layer on the true lateral view, compatible with milk of calcium.  Group of calcifications within the upper-outer quadrant of the left breast in the mid to posterior depth appear round on spot compression magnification views.  Additionally on the spot compression magnification views is a second smaller group of punctate and round calcifications.  Questioned mass within the left axillary region on the left MLO view is less conspicuous on spot compression magnification views however does appear to persist on the true lateral view.  This will be further evaluated with ultrasound.  Mammographic images were processed with CAD.  On physical exam, I palpate no discrete mass within the left axillary region.  Ultrasound  is performed, showing no concerning mass in the left axillary region.  IMPRESSION: 1.  Calcifications within the upper-outer quadrant of the right breast predominately layer on true lateral view, compatible with milk of calcium. 2.  Group of round calcifications within the upper-outer quadrant of the left breast with a second smaller group of tiny round and punctate calcifications demonstrated best on spot compression magnification views. 3.  Focal asymmetry within the left axillary region without ultrasound correlate is probably benign.  RECOMMENDATION: Bilateral spot compression magnification views of the bilateral breast calcifications and additional mammographic views of the focal asymmetry within the left axillary region in 6 months.  I have discussed the findings and recommendations with the patient.  Results were also provided in writing at the conclusion of the visit.  If applicable, a reminder letter will be sent to the patient regarding the next appointment.  BI-RADS CATEGORY 3:  Probably benign finding(s) - short interval follow-up suggested.   Original Report Authenticated By: Annia Belt, M.D   [facility] Digital [facility] Bilat  10/06/2012   *RADIOLOGY REPORT*  Clinical Data:  The patient was recalled from screening for bilateral breast calcifications and asymmetry within the left axillary region on the left MLO view.  DIGITAL DIAGNOSTIC BILATERAL MAMMOGRAM WITH CAD AND LEFT BREAST ULTRASOUND:  Comparison:  09/14/2012, baseline  Findings:  ACR Breast Density Category c:  The breast tissue is heterogeneously dense, which may obscure small masses.  Two separate groups of calcifications within the upper-outer quadrant of the right breast were evaluated with spot compression magnification views.  The majority of these calcifications appear to layer on the true lateral view, compatible with milk of calcium.  Group of calcifications within the upper-outer quadrant of the left breast in the mid to posterior depth appear round on spot compression magnification views.  Additionally on the spot compression magnification views is a second smaller group of punctate and round calcifications.  Questioned mass within the left axillary region on the left MLO view is less conspicuous on spot compression magnification views however does appear to persist on the true lateral view.  This will be further evaluated with ultrasound.  Mammographic images were processed with CAD.  On physical exam, I palpate no discrete mass within the left axillary region.  Ultrasound is performed, showing no concerning mass in the left axillary region.  IMPRESSION: 1.  Calcifications within the upper-outer quadrant of the right breast predominately layer on true lateral view, compatible with milk of calcium. 2.  Group of round calcifications within  the upper-outer quadrant of the left breast with a second smaller group of tiny round and punctate calcifications demonstrated best on spot compression magnification views. 3.  Focal asymmetry within the left axillary region without ultrasound correlate is probably benign.  RECOMMENDATION: Bilateral spot compression magnification views of the bilateral breast calcifications and additional mammographic views of the focal asymmetry within the left axillary region in 6 months.  I have discussed the findings and recommendations with the patient. Results were also provided in writing at the conclusion of the visit.  If applicable, a reminder letter will be sent to the patient regarding the next appointment.  BI-RADS CATEGORY 3:  Probably benign finding(s) - short interval follow-up suggested.   Original Report Authenticated By: Annia Belt, M.D      Assessment: Patient Active Problem List   Diagnosis Date Noted  . Anemia 07/06/2012  . Menorrhagia with irregular cycle 07/06/2012  . Abnormal uterine bleeding 11/08/2010  . HTN (hypertension) 11/08/2010  .  Iron deficiency 11/08/2010  . Folate deficiency 11/08/2010  . Melena 11/07/2010  . Anemia, unspecified 11/06/2010  . Tobacco abuse 11/06/2010    Plan: hysterosocpy uterine curettage and endometrial ablation  11/17/2012  Braden Deloach Atkinson 11/03/2012 3:04 PM

## 2012-11-04 ENCOUNTER — Encounter (HOSPITAL_COMMUNITY): Payer: Self-pay | Admitting: Pharmacy Technician

## 2012-11-09 NOTE — Patient Instructions (Signed)
[name]  11/09/2012   Your procedure is scheduled on:  11/17/12  Report to [facility] at 07:00 AM.  Call this number if you have problems the morning of surgery: [phone]   Remember:   Do not eat food or drink liquids after midnight.   Take these medicines the morning of surgery with A SIP OF WATER: Omeprazole.   Do not wear jewelry, make-up or nail polish.  Do not wear lotions, powders, or perfumes.   Do not shave 48 hours prior to surgery. Men may shave face and neck.  Do not bring valuables to the hospital.  [facility] is not responsible for any belongings or valuables.               Contacts, dentures or bridgework may not be worn into surgery.  Leave suitcase in the car. After surgery it may be brought to your room.  For patients admitted to the hospital, discharge time is determined by your treatment team.               Patients discharged the day of surgery will not be allowed to drive home.    Special Instructions: Shower using CHG 2 nights before surgery and the night before surgery.  If you shower the day of surgery use CHG.  Use special wash - you have one bottle of CHG for all showers.  You should use approximately 1/3 of the bottle for each shower.   Please read over the following fact sheets that you were given: Pain Booklet, Surgical Site Infection Prevention, Anesthesia Post-op Instructions and Care and Recovery After Surgery    Hysteroscopy Hysteroscopy is a procedure used for looking inside the womb (uterus). It may be done for many different reasons, including:  To evaluate abnormal bleeding, fibroid (benign, noncancerous) tumors, polyps, scar tissue (adhesions), and possibly cancer of the uterus.  To look for lumps (tumors) and other uterine growths.  To look for causes of why a woman cannot get pregnant (infertility), causes of recurrent loss of pregnancy (miscarriages), or a lost intrauterine device (IUD).  To perform a sterilization by  blocking the fallopian tubes from inside the uterus. A hysteroscopy should be done right after a menstrual period to be sure you are not pregnant. LET YOUR CAREGIVER KNOW ABOUT:   Allergies.  Medicines taken, including herbs, eyedrops, over-the-counter medicines, and creams.  Use of steroids (by mouth or creams).  Previous problems with anesthetics or numbing medicines.  History of bleeding or blood problems.  History of blood clots.  Possibility of pregnancy, if this applies.  Previous surgery.  Other health problems. RISKS AND COMPLICATIONS   Putting a hole in the uterus.  Excessive bleeding.  Infection.  Damage to the cervix.  Injury to other organs.  Allergic reaction to medicines.  Too much fluid used in the uterus for the procedure. BEFORE THE PROCEDURE   Do not take aspirin or blood thinners for a week before the procedure, or as directed. It can cause bleeding.  Arrive at least 60 minutes before the procedure or as directed to read and sign the necessary forms.  Arrange for someone to take you home after the procedure.  If you smoke, do not smoke for 2 weeks before the procedure. PROCEDURE   Your caregiver may give you medicine to relax you. He or she may also give you a medicine that numbs the area around the cervix (local anesthetic) or a medicine that makes you sleep (general anesthesia).  Sometimes, a medicine is placed in the cervix the day before the procedure. This medicine makes the cervix have a larger opening (dilate). This makes it easier for the instrument to be inserted into the uterus.  A small instrument (hysteroscope) is inserted through the vagina into the uterus. This instrument is similar to a pencil-sized telescope with a light.  During the procedure, air or a liquid is put into the uterus, which allows the surgeon to see better.  Sometimes, tissue is gently scraped from inside the uterus. These tissue samples are sent to a specialist  who looks at tissue samples (pathologist). The pathologist will give a report to your caregiver. This will help your caregiver decide if further treatment is necessary. The report will also help your caregiver decide on the best treatment if the test comes back abnormal. AFTER THE PROCEDURE   If you had a general anesthetic, you may be groggy for a couple hours after the procedure.  If you had a local anesthetic, you will be advised to rest at the surgical center or caregiver's office until you are stable and feel ready to go home.  You may have some cramping for a couple days.  You may have bleeding, which varies from light spotting for a few days to menstrual-like bleeding for up to 3 to 7 days. This is normal.  Have someone take you home. FINDING OUT THE RESULTS OF YOUR TEST Not all test results are available during your visit. If your test results are not back during the visit, make an appointment with your caregiver to find out the results. Do not assume everything is normal if you have not heard from your caregiver or the medical facility. It is important for you to follow up on all of your test results. HOME CARE INSTRUCTIONS   Do not drive for 24 hours or as instructed.  Only take over-the-counter or prescription medicines for pain, discomfort, or fever as directed by your caregiver.  Do not take aspirin. It can cause or aggravate bleeding.  Do not drive or drink alcohol while taking pain medicine.  You may resume your usual diet.  Do not use tampons, douche, or have sexual intercourse for 2 weeks, or as advised by your caregiver.  Rest and sleep for the first 24 to 48 hours.  Take your temperature twice a day for 4 to 5 days. Write it down. Give these temperatures to your caregiver if they are abnormal (above 98.6 F or 37.0 C).  Take medicines your caregiver has ordered as directed.  Follow your caregiver's advice regarding diet, exercise, lifting, driving, and general  activities.  Take showers instead of baths for 2 weeks, or as recommended by your caregiver.  If you develop constipation:  Take a mild laxative with the advice of your caregiver.  Eat bran foods.  Drink enough water and fluids to keep your urine clear or pale yellow.  Try to have someone with you or available to you for the first 24 to 48 hours, especially if you had a general anesthetic.  Make sure you and your family understand everything about your operation and recovery.  Follow your caregiver's advice regarding follow-up appointments and Pap smears. SEEK MEDICAL CARE IF:   You feel dizzy or lightheaded.  You feel sick to your stomach (nauseous).  You develop abnormal vaginal discharge.  You develop a rash.  You have an abnormal reaction or allergy to your medicine.  You need stronger pain medicine. SEEK IMMEDIATE MEDICAL  CARE IF:   Bleeding is heavier than a normal menstrual period or you have blood clots.  You have an oral temperature above 102 F (38.9 C), not controlled by medicine.  You have increasing cramps or pains not relieved with medicine.  You develop belly (abdominal) pain that does not seem to be related to the same area of earlier cramping and pain.  You pass out.  You develop pain in the tops of your shoulders (shoulder strap areas).  You develop shortness of breath. MAKE SURE YOU:   Understand these instructions.  Will watch your condition.  Will get help right away if you are not doing well or get worse. Document Released: 04/21/2000 Document Revised: 04/07/2011 Document Reviewed: 08/14/2008 [facility] Patient Information 2014 [place], [place].    Dilation and Curettage or Vacuum Curettage Dilation and curettage (D&C) and vacuum curettage are minor procedures. A D&C involves stretching (dilation) the cervix and scraping (curettage) the inside lining of the womb (uterus). During a D&C, tissue is gently scraped from the inside lining of the  uterus. During a vacuum curettage, the lining and tissue in the uterus are removed with the use of gentle suction. Curettage may be performed for diagnostic or therapeutic purposes. As a diagnostic procedure, curettage is performed for the purpose of examining tissues from the uterus. Tissue examination may help determine causes or treatment options for symptoms. A diagnostic curettage may be performed for the following symptoms:  Irregular bleeding in the uterus.  Bleeding with the development of clots.  Spotting between menstrual periods.  Prolonged menstrual periods.  Bleeding after menopause.  No menstrual period (amenorrhea).  A change in size and shape of the uterus. A therapeutic curettage is performed to remove tissue, blood, or a contraceptive device. Therapeutic curettage may be performed for the following conditions:   Removal of an IUD (intrauterine device).  Removal of retained placenta after giving birth. Retained placenta can cause bleeding severe enough to require transfusions or an infection.  Abortion.  Miscarriage.  Removal of polyps inside the uterus.  Removal of uncommon types of fibroids (noncancerous lumps). LET YOUR CAREGIVER KNOW ABOUT:   Allergies to food or medicine.  Medicines taken, including vitamins, herbs, eyedrops, over-the-counter medicines, and creams.  Use of steroids (by mouth or creams).  Previous problems with anesthetics or numbing medicines.  History of bleeding problems or blood clots.  Previous surgery.  Other health problems, including diabetes and kidney problems.  Possibility of pregnancy, if this applies. RISKS AND COMPLICATIONS   Excessive bleeding.  Infection of the uterus.  Damage to the cervix.  Development of scar tissue (adhesions) inside the uterus, later causing abnormal amounts of menstrual bleeding.  Complications from the general anesthetic, if a general anesthetic is used.  Putting a hole  (perforation) in the uterus. This is rare. BEFORE THE PROCEDURE   Eat and drink before the procedure only as directed by your caregiver.  Arrange for someone to take you home. PROCEDURE   This procedure may be done in a hospital, outpatient clinic, or caregiver's office.  You may be given a general anesthetic or a local anesthetic in and around the cervix.  You will lie on your back with your legs in stirrups.  There are two ways in which your cervix can be softened and dilated. These include:  Taking a medicine.  Having thin rods (laminaria) inserted into your cervix.  A curved tool (curette) will scrape cells from the inside lining of the uterus and will then be  removed. This procedure usually takes about 15 to 30 minutes. AFTER THE PROCEDURE   You will rest in the recovery area until you are stable and are ready to go home.  You will need to have someone take you home.  You may feel sick to your stomach (nauseous) or throw up (vomit) if you had general anesthesia.  You may have a sore throat if a tube was placed in your throat during general anesthesia.  You may have light cramping and bleeding for 2 days to 2 weeks after the procedure.  Your uterus needs to make a new lining after the procedure. This may make your next period late. Document Released: 01/13/2005 Document Revised: 04/07/2011 Document Reviewed: 08/11/2008 [facility] Patient Information 2014 [place], [place].    Endometrial Ablation Endometrial ablation removes the lining of the uterus (endometrium). It is usually a same day, outpatient treatment. Ablation helps avoid major surgery (such as a hysterectomy). A hysterectomy is removal of the cervix and uterus. Endometrial ablation has less risk and complications, has a shorter recovery period and is less expensive. After endometrial ablation, most women will have little or no menstrual bleeding. You may not keep your fertility. Pregnancy is no longer likely after  this procedure but if you are pre-menopausal, you still need to use a reliable method of birth control following the procedure because pregnancy can occur. REASONS TO HAVE THE PROCEDURE MAY INCLUDE:  Heavy periods.  Bleeding that is causing anemia.  Anovulatory bleeding, very irregular, bleeding.  Bleeding submucous fibroids (on the lining inside the uterus) if they are smaller than 3 centimeters. REASONS NOT TO HAVE THE PROCEDURE MAY INCLUDE:  You wish to have more children.  You have a pre-cancerous or cancerous problem. The cause of any abnormal bleeding must be diagnosed before having the procedure.  You have pain coming from the uterus.  You have a submucus fibroid larger than 3 centimeters.  You recently had a baby.  You recently had an infection in the uterus.  You have a severe retro-flexed, tipped uterus and cannot insert the instrument to do the ablation.  You had a Cesarean section or deep major surgery on the uterus.  The inner cavity of the uterus is too large for the endometrial ablation instrument. RISKS AND COMPLICATIONS   Perforation of the uterus.  Bleeding.  Infection of the uterus, bladder or vagina.  Injury to surrounding organs.  Cutting the cervix.  An air bubble to the lung (air embolus).  Pregnancy following the procedure.  Failure of the procedure to help the problem requiring hysterectomy.  Decreased ability to diagnose cancer in the lining of the uterus. BEFORE THE PROCEDURE  The lining of the uterus must be tested to make sure there is no pre-cancerous or cancer cells present.  Medications may be given to make the lining of the uterus thinner.  Ultrasound may be used to evaluate the size and look for abnormalities of the uterus.  Future pregnancy is not desired. PROCEDURE  There are different ways to destroy the lining of the uterus.   Resectoscope - radio frequency-alternating electric current is the most common one  used.  Cryotherapy - freezing the lining of the uterus.  Heated Free Liquid - heated salt (saline) solution inserted into the uterus.  Microwave - uses high energy microwaves in the uterus.  Thermal Balloon - a catheter with a balloon tip is inserted into the uterus and filled with heated fluid. Your caregiver will talk with you about the method  used in this clinic. They will also instruct you on the pros and cons of the procedure. Endometrial ablation is performed along with a procedure called operative hysteroscopy. A narrow viewing tube is inserted through the birth canal (vagina) and through the cervix into the uterus. A tiny camera attached to the viewing tube (hysteroscope) allows the uterine cavity to be shown on a TV monitor during surgery. Your uterus is filled with a harmless liquid to make the procedure easier. The lining of the uterus is then removed. The lining can also be removed with a resectoscope which allows your surgeon to cut away the lining of the uterus under direct vision. Usually, you will be able to go home within an hour after the procedure. HOME CARE INSTRUCTIONS   Do not drive for 24 hours.  No tampons, douching or intercourse for 2 weeks or until your caregiver approves.  Rest at home for 24 to 48 hours. You may then resume normal activities unless told differently by your caregiver.  Take your temperature two times a day for 4 days, and record it.  Take any medications your caregiver has ordered, as directed.  Use some form of contraception if you are pre-menopausal and do not want to get pregnant. Bleeding after the procedure is normal. It varies from light spotting and mildly watery to bloody discharge for 4 to 6 weeks. You may also have mild cramping. Only take over-the-counter or prescription medicines for pain, discomfort, or fever as directed by your caregiver. Do not use aspirin, as this may aggravate bleeding. Frequent urination during the first 24 hours  is normal. You will not know how effective your surgery is until at least 3 months after the surgery. SEEK IMMEDIATE MEDICAL CARE IF:   Bleeding is heavier than a normal menstrual cycle.  An oral temperature above 102 F (38.9 C) develops.  You have increasing cramps or pains not relieved with medication or develop belly (abdominal) pain which does not seem to be related to the same area of earlier cramping and pain.  You are light headed, weak or have fainting episodes.  You develop pain in the shoulder strap areas.  You have chest or leg pain.  You have abnormal vaginal discharge.  You have painful urination. Document Released: 11/23/2003 Document Revised: 04/07/2011 Document Reviewed: 02/20/2007 [facility] Patient Information 2014 [place], [place].    PATIENT INSTRUCTIONS POST-ANESTHESIA  IMMEDIATELY FOLLOWING SURGERY:  Do not drive or operate machinery for the first twenty four hours after surgery.  Do not make any important decisions for twenty four hours after surgery or while taking narcotic pain medications or sedatives.  If you develop intractable nausea and vomiting or a severe headache please notify your doctor immediately.  FOLLOW-UP:  Please make an appointment with your surgeon as instructed. You do not need to follow up with anesthesia unless specifically instructed to do so.  WOUND CARE INSTRUCTIONS (if applicable):  Keep a dry clean dressing on the anesthesia/puncture wound site if there is drainage.  Once the wound has quit draining you may leave it open to air.  Generally you should leave the bandage intact for twenty four hours unless there is drainage.  If the epidural site drains for more than 36-48 hours please call the anesthesia department.  QUESTIONS?:  Please feel free to call your physician or the hospital operator if you have any questions, and they will be happy to assist you.

## 2012-11-10 ENCOUNTER — Encounter (HOSPITAL_COMMUNITY)
Admission: RE | Admit: 2012-11-10 | Discharge: 2012-11-10 | Disposition: A | Discharge status: Home / Self Care | Payer: Self-pay | Source: Ambulatory Visit | Attending: Obstetrics & Gynecology | Admitting: Obstetrics & Gynecology

## 2012-11-10 ENCOUNTER — Encounter (HOSPITAL_COMMUNITY): Payer: Self-pay

## 2012-11-10 DIAGNOSIS — Z01812 Encounter for preprocedural laboratory examination: Secondary | ICD-10-CM | POA: Insufficient documentation

## 2012-11-10 LAB — URINALYSIS, ROUTINE W REFLEX MICROSCOPIC
Ketones, ur: NEGATIVE mg/dL
Leukocytes, UA: NEGATIVE
Nitrite: NEGATIVE
pH: 6.5 (ref 5.0–8.0)

## 2012-11-10 LAB — COMPREHENSIVE METABOLIC PANEL
Alkaline Phosphatase: 62 U/L (ref 39–117)
BUN: 7 mg/dL (ref 6–23)
Calcium: 10 mg/dL (ref 8.4–10.5)
Creatinine, Ser: 0.85 mg/dL (ref 0.50–1.10)
GFR calc Af Amer: >90 mL/min (ref >90)
Glucose, Bld: 108 mg/dL — ABNORMAL HIGH (ref 70–99)
Potassium: 4.1 mEq/L (ref 3.5–5.1)
Total Protein: 7.8 g/dL (ref 6.0–8.3)

## 2012-11-10 LAB — URINE MICROSCOPIC-ADD ON

## 2012-11-10 LAB — CBC
HCT: 39.1 % (ref 36.0–46.0)
Hemoglobin: 12.7 g/dL (ref 12.0–15.0)
MCH: 24.2 pg — ABNORMAL LOW (ref 26.0–34.0)
MCHC: 32.5 g/dL (ref 30.0–36.0)
MCV: 74.6 fL — ABNORMAL LOW (ref 78.0–100.0)

## 2012-11-15 ENCOUNTER — Telehealth: Payer: Self-pay | Admitting: Obstetrics & Gynecology

## 2012-11-15 NOTE — Telephone Encounter (Signed)
Pt states bleeding "a little bit" can pt still have the endo. Ablation this Wednesday. Pt informed Dr. Despina Hidden will still be able to perform procedure.

## 2012-11-17 ENCOUNTER — Ambulatory Visit (HOSPITAL_COMMUNITY)
Admission: RE | Admit: 2012-11-17 | Discharge: 2012-11-17 | Disposition: A | Discharge status: Home / Self Care | Payer: Self-pay | Source: Ambulatory Visit | Attending: Obstetrics & Gynecology | Admitting: Obstetrics & Gynecology

## 2012-11-17 ENCOUNTER — Encounter (HOSPITAL_COMMUNITY): Payer: Self-pay | Admitting: *Deleted

## 2012-11-17 ENCOUNTER — Encounter (HOSPITAL_COMMUNITY): Payer: Self-pay | Admitting: Anesthesiology

## 2012-11-17 ENCOUNTER — Ambulatory Visit (HOSPITAL_COMMUNITY): Payer: Self-pay | Admitting: Anesthesiology

## 2012-11-17 ENCOUNTER — Encounter (HOSPITAL_COMMUNITY)
Admission: RE | Disposition: A | Discharge status: Home / Self Care | Payer: Self-pay | Source: Ambulatory Visit | Attending: Obstetrics & Gynecology

## 2012-11-17 DIAGNOSIS — Z9889 Other specified postprocedural states: Secondary | ICD-10-CM

## 2012-11-17 DIAGNOSIS — J4489 Other specified chronic obstructive pulmonary disease: Secondary | ICD-10-CM | POA: Insufficient documentation

## 2012-11-17 DIAGNOSIS — J449 Chronic obstructive pulmonary disease, unspecified: Secondary | ICD-10-CM | POA: Insufficient documentation

## 2012-11-17 DIAGNOSIS — N946 Dysmenorrhea, unspecified: Secondary | ICD-10-CM | POA: Insufficient documentation

## 2012-11-17 DIAGNOSIS — D500 Iron deficiency anemia secondary to blood loss (chronic): Secondary | ICD-10-CM

## 2012-11-17 DIAGNOSIS — N920 Excessive and frequent menstruation with regular cycle: Secondary | ICD-10-CM

## 2012-11-17 DIAGNOSIS — I10 Essential (primary) hypertension: Secondary | ICD-10-CM | POA: Insufficient documentation

## 2012-11-17 DIAGNOSIS — D649 Anemia, unspecified: Secondary | ICD-10-CM | POA: Insufficient documentation

## 2012-11-17 HISTORY — PX: DILITATION & CURRETTAGE/HYSTROSCOPY WITH THERMACHOICE ABLATION: SHX5569

## 2012-11-17 SURGERY — DILATATION & CURETTAGE/HYSTEROSCOPY WITH THERMACHOICE ABLATION
Anesthesia: General | Site: Vagina | Wound class: Clean Contaminated

## 2012-11-17 MED ORDER — CEFAZOLIN SODIUM-DEXTROSE 2-3 GM-% IV SOLR
INTRAVENOUS | Status: AC
  Filled 2012-11-17: qty 50

## 2012-11-17 MED ORDER — FENTANYL CITRATE 0.05 MG/ML IJ SOLN
INTRAMUSCULAR | Status: DC | PRN
  Administered 2012-11-17 (×3): 50 ug via INTRAVENOUS
  Administered 2012-11-17: 100 ug via INTRAVENOUS

## 2012-11-17 MED ORDER — SUCCINYLCHOLINE CHLORIDE 20 MG/ML IJ SOLN
INTRAMUSCULAR | Status: DC | PRN
  Administered 2012-11-17: 100 mg via INTRAVENOUS

## 2012-11-17 MED ORDER — SODIUM CHLORIDE 0.9 % IR SOLN
Status: DC | PRN
  Administered 2012-11-17: 1000 mL

## 2012-11-17 MED ORDER — LIDOCAINE HCL (CARDIAC) 20 MG/ML IV SOLN
INTRAVENOUS | Status: DC | PRN
  Administered 2012-11-17: 50 mg via INTRAVENOUS

## 2012-11-17 MED ORDER — ONDANSETRON HCL 4 MG/2ML IJ SOLN: 4.0000 mg | Freq: Once | INTRAMUSCULAR | Status: DC | PRN

## 2012-11-17 MED ORDER — PROPOFOL 10 MG/ML IV BOLUS
INTRAVENOUS | Status: AC
  Filled 2012-11-17: qty 20

## 2012-11-17 MED ORDER — LIDOCAINE HCL (PF) 1 % IJ SOLN
INTRAMUSCULAR | Status: AC
  Filled 2012-11-17: qty 5

## 2012-11-17 MED ORDER — ONDANSETRON HCL 8 MG PO TABS: 8.0000 mg | ORAL_TABLET | Freq: Three times a day (TID) | ORAL | Status: DC | PRN

## 2012-11-17 MED ORDER — CEFAZOLIN SODIUM-DEXTROSE 2-3 GM-% IV SOLR
2.0000 g | INTRAVENOUS | Status: AC
  Administered 2012-11-17: 2 g via INTRAVENOUS

## 2012-11-17 MED ORDER — LACTATED RINGERS IV SOLN: INTRAVENOUS | Status: DC

## 2012-11-17 MED ORDER — FENTANYL CITRATE 0.05 MG/ML IJ SOLN
25.0000 ug | INTRAMUSCULAR | Status: DC | PRN
  Administered 2012-11-17 (×2): 50 ug via INTRAVENOUS

## 2012-11-17 MED ORDER — MIDAZOLAM HCL 2 MG/2ML IJ SOLN
INTRAMUSCULAR | Status: AC
  Filled 2012-11-17: qty 2

## 2012-11-17 MED ORDER — ONDANSETRON HCL 4 MG/2ML IJ SOLN
4.0000 mg | Freq: Once | INTRAMUSCULAR | Status: AC
  Administered 2012-11-17: 4 mg via INTRAVENOUS

## 2012-11-17 MED ORDER — KETOROLAC TROMETHAMINE 30 MG/ML IJ SOLN
INTRAMUSCULAR | Status: AC
  Filled 2012-11-17: qty 1

## 2012-11-17 MED ORDER — HYDROCODONE-ACETAMINOPHEN 5-325 MG PO TABS: 1.0000 | ORAL_TABLET | Freq: Four times a day (QID) | ORAL | Status: DC | PRN

## 2012-11-17 MED ORDER — MIDAZOLAM HCL 2 MG/2ML IJ SOLN
1.0000 mg | INTRAMUSCULAR | Status: DC | PRN
  Administered 2012-11-17: 2 mg via INTRAVENOUS

## 2012-11-17 MED ORDER — PROPOFOL 10 MG/ML IV BOLUS
INTRAVENOUS | Status: DC | PRN
  Administered 2012-11-17: 150 mg via INTRAVENOUS

## 2012-11-17 MED ORDER — KETOROLAC TROMETHAMINE 10 MG PO TABS: 10.0000 mg | ORAL_TABLET | Freq: Three times a day (TID) | ORAL | Status: DC | PRN

## 2012-11-17 MED ORDER — FENTANYL CITRATE 0.05 MG/ML IJ SOLN
INTRAMUSCULAR | Status: AC
  Filled 2012-11-17: qty 5

## 2012-11-17 MED ORDER — LACTATED RINGERS IV SOLN
INTRAVENOUS | Status: DC | PRN
  Administered 2012-11-17: 1000 mL
  Administered 2012-11-17: 07:00:00 via INTRAVENOUS

## 2012-11-17 MED ORDER — SUCCINYLCHOLINE CHLORIDE 20 MG/ML IJ SOLN
INTRAMUSCULAR | Status: AC
  Filled 2012-11-17: qty 1

## 2012-11-17 MED ORDER — DEXTROSE 5 % IV SOLN
INTRAVENOUS | Status: DC | PRN
  Administered 2012-11-17: 21 mL via INTRAVENOUS

## 2012-11-17 MED ORDER — ONDANSETRON HCL 4 MG/2ML IJ SOLN
INTRAMUSCULAR | Status: AC
  Filled 2012-11-17: qty 2

## 2012-11-17 MED ORDER — FENTANYL CITRATE 0.05 MG/ML IJ SOLN
INTRAMUSCULAR | Status: AC
  Filled 2012-11-17: qty 2

## 2012-11-17 MED ORDER — KETOROLAC TROMETHAMINE 30 MG/ML IJ SOLN
30.0000 mg | Freq: Once | INTRAMUSCULAR | Status: AC
  Administered 2012-11-17: 30 mg via INTRAVENOUS

## 2012-11-17 SURGICAL SUPPLY — 31 items
BAG DECANTER FOR FLEXI CONT (MISCELLANEOUS) ×2 IMPLANT
BAG HAMPER (MISCELLANEOUS) ×2 IMPLANT
CATH THERMACHOICE III (CATHETERS) ×2 IMPLANT
CLOTH BEACON ORANGE TIMEOUT ST (SAFETY) ×2 IMPLANT
COVER LIGHT HANDLE STERIS (MISCELLANEOUS) ×4 IMPLANT
COVER MAYO STAND XLG (DRAPE) ×2 IMPLANT
FORMALIN 10 PREFIL 120ML (MISCELLANEOUS) ×2 IMPLANT
GAUZE SPONGE 4X4 16PLY XRAY LF (GAUZE/BANDAGES/DRESSINGS) ×2 IMPLANT
GLOVE BIOGEL PI IND STRL 7.0 (GLOVE) ×2 IMPLANT
GLOVE BIOGEL PI IND STRL 8 (GLOVE) ×1 IMPLANT
GLOVE BIOGEL PI INDICATOR 7.0 (GLOVE) ×2
GLOVE BIOGEL PI INDICATOR 8 (GLOVE) ×1
GLOVE ECLIPSE 8.0 STRL XLNG CF (GLOVE) ×2 IMPLANT
GLOVE SS BIOGEL STRL SZ 6.5 (GLOVE) ×1 IMPLANT
GLOVE SUPERSENSE BIOGEL SZ 6.5 (GLOVE) ×1
GOWN STRL REIN XL XLG (GOWN DISPOSABLE) ×6 IMPLANT
INST SET HYSTEROSCOPY (KITS) ×2 IMPLANT
IV D5W 500ML (IV SOLUTION) ×2 IMPLANT
IV NS IRRIG 3000ML ARTHROMATIC (IV SOLUTION) ×2 IMPLANT
KIT ROOM TURNOVER AP CYSTO (KITS) ×2 IMPLANT
MANIFOLD NEPTUNE II (INSTRUMENTS) ×2 IMPLANT
MARKER SKIN DUAL TIP RULER LAB (MISCELLANEOUS) ×2 IMPLANT
NS IRRIG 1000ML POUR BTL (IV SOLUTION) ×2 IMPLANT
PACK BASIC III (CUSTOM PROCEDURE TRAY) ×2
PACK SRG BSC III STRL LF ECLPS (CUSTOM PROCEDURE TRAY) ×1 IMPLANT
PAD ARMBOARD 7.5X6 YLW CONV (MISCELLANEOUS) ×2 IMPLANT
PAD TELFA 3X4 1S STER (GAUZE/BANDAGES/DRESSINGS) ×2 IMPLANT
SET BASIN LINEN APH (SET/KITS/TRAYS/PACK) ×2 IMPLANT
SET IRRIG Y TYPE TUR BLADDER L (SET/KITS/TRAYS/PACK) ×2 IMPLANT
SHEET LAVH (DRAPES) ×2 IMPLANT
YANKAUER SUCT BULB TIP 10FT TU (MISCELLANEOUS) ×2 IMPLANT

## 2012-11-17 NOTE — Preoperative (Signed)
Beta Blockers   Reason not to administer Beta Blockers:Not Applicable 

## 2012-11-17 NOTE — Op Note (Signed)
Preoperative diagnosis: Menometrorrhagia                                        Dysmenorrhea                                         Anemia requiring transfusion  Postoperative diagnoses: Same as above   Procedure: Hysteroscopy, uterine curettage, endometrial ablation  Surgeon: Despina Hidden MD  Anesthesia: Laryngeal mask airway  Findings: The endometrium was normal. There were no fibroid or other abnormalities.  Description of operation: The patient was taken to the operating room and placed in the supine position. She underwent general anesthesia using the laryngeal mask airway. She was placed in the dorsal lithotomy position and prepped and draped in the usual sterile fashion. A Graves speculum was placed and the anterior cervical lip was grasped with a single-tooth tenaculum. The cervix was dilated serially to allow passage of the hysteroscope. Diagnostic hysteroscopy was performed and was found to be normal. A vigorous uterine curettage was then performed and all tissue sent to pathology for evaluation. The ThermaChoice 3 endometrial ablation balloon was then used were 21 cc of D5W was required to maintain a pressure of 190-200 mm of mercury throughout the procedure. Toatl therapy time was 9:36.  All of the equipment worked well throughout the procedure. All of the fluid was returned at the end of the procedure. The patient was awakened from anesthesia and taken to the recovery room in good stable condition all counts were correct. She received 2 g of Ancef and 30 mg of Toradol preoperatively. She will be discharged from the recovery room and followed up in the office in 1- 2 weeks.  [name] 11/17/2012 9:22 AM

## 2012-11-17 NOTE — Transfer of Care (Signed)
Immediate Anesthesia Transfer of Care Note  Patient: [name]  Procedure(s) Performed: Procedure(s) with comments: DILATATION & CURETTAGE/HYSTEROSCOPY WITH THERMACHOICE ABLATION (N/A) - Total D5 in 21ml, total D5 out 21ml; temp 86-87 degrees Celcius; total time 9 minutes and 36 seconds;   Patient Location: PACU  Anesthesia Type:General  Level of Consciousness: awake, alert , oriented and patient cooperative  Airway & Oxygen Therapy: Patient Spontanous Breathing and Patient connected to face mask oxygen  Post-op Assessment: Report given to PACU RN and Post -op Vital signs reviewed and stable  Post vital signs: Reviewed and stable  Complications: No apparent anesthesia complications

## 2012-11-17 NOTE — Anesthesia Postprocedure Evaluation (Signed)
  Anesthesia Post-op Note  Patient: [name]  Procedure(s) Performed: Procedure(s) with comments: DILATATION & CURETTAGE/HYSTEROSCOPY WITH THERMACHOICE ABLATION (N/A) - Total D5 in 21ml, total D5 out 21ml; temp 86-87 degrees Celcius; total time 9 minutes and 36 seconds;   Patient Location: PACU  Anesthesia Type:General  Level of Consciousness: awake, alert , oriented and patient cooperative  Airway and Oxygen Therapy: Patient Spontanous Breathing and Patient connected to face mask oxygen  Post-op Pain: mild  Post-op Assessment: Post-op Vital signs reviewed, Patient's Cardiovascular Status Stable, Respiratory Function Stable, Patent Airway, No signs of Nausea or vomiting and Pain level controlled  Post-op Vital Signs: Reviewed and stable  Complications: No apparent anesthesia complications

## 2012-11-17 NOTE — Anesthesia Procedure Notes (Signed)
Procedure Name: Intubation Date/Time: 11/17/2012 8:47 AM Performed by: Carolyne Littles, Angelica Frandsen L Pre-anesthesia Checklist: Patient identified, Patient being monitored, Timeout performed, Emergency Drugs available and Suction available Patient Re-evaluated:Patient Re-evaluated prior to inductionOxygen Delivery Method: Circle System Utilized Preoxygenation: Pre-oxygenation with 100% oxygen Intubation Type: IV induction, Rapid sequence and Cricoid Pressure applied Laryngoscope Size: 3 and Miller Grade View: Grade I Tube type: Oral Tube size: 7.0 mm Number of attempts: 1 Airway Equipment and Method: stylet Placement Confirmation: ETT inserted through vocal cords under direct vision,  positive ETCO2 and breath sounds checked- equal and bilateral Secured at: 21 cm Tube secured with: Tape Dental Injury: Teeth and Oropharynx as per pre-operative assessment

## 2012-11-17 NOTE — H&P (Signed)
Preoperative History and Physical  [name] is a 47 y.o. [id number] with Patient's last menstrual period was 11/03/2012. admitted for a hysteroscopy uterine curettage and endo ablation because of a history of heavy menstrual periods over the last 2 years, required transfusion x 2 6 units total.  PMH:  Past Medical History   Diagnosis  Date   .  COPD (chronic obstructive pulmonary disease)    .  Anemia    .  Pleurisy    .  GERD (gastroesophageal reflux disease)    PSH:  Past Surgical History   Procedure  Laterality  Date   .  Tubal ligation     .  Cyst removed from right face     .  Esophagogastroduodenoscopy   11/07/2010     Procedure: ESOPHAGOGASTRODUODENOSCOPY (EGD); Surgeon: Corbin Ade, MD; Location: [facility]; Service: Endoscopy; Laterality: N/A;   POb/GynH:  OB History    Grav  Para  Term  Preterm  Abortions  TAB  SAB  Ect  Mult  Living    2  2  2        2      SH:  History   Substance Use Topics   .  Smoking status:  Current Every Day Smoker -- 0.50 packs/day for 20 years     Types:  Cigarettes   .  Smokeless tobacco:  Never Used   .  Alcohol Use:  No   FH:  Family History   Problem  Relation  Age of Onset   .  Colon cancer  Father  64     deceased secondary to kidney failure   .  Colon cancer  Brother  80     recent diagnosis (2012). Finishing chemotherapy.   .  Breast cancer  Maternal Aunt    Allergies: No Known Allergies  Medications: Current outpatient prescriptions:ferrous sulfate 325 (65 FE) MG tablet, Take 1 tablet (325 mg total) by mouth 3 (three) times daily with meals., Disp: 90 tablet, Rfl: 0; megestrol (MEGACE) 40 MG tablet, Take 3 tablets a day for 5 days, 2 tablets a day for 5 days then 1 a day until Dr Despina Hidden stops them, Disp: 45 tablet, Rfl: 6; omeprazole (PRILOSEC) 20 MG capsule, Take 20 mg by mouth daily., Disp: , Rfl:  naproxen (NAPROSYN) 500 MG tablet, Take 1 tablet (500 mg total) by mouth 2 (two) times daily with a meal., Disp: 30 tablet,  Rfl: 0  Review of Systems:  Review of Systems  Constitutional: Negative for fever, chills, weight loss, malaise/fatigue and diaphoresis.  HENT: Negative for hearing loss, ear pain, nosebleeds, congestion, sore throat, neck pain, tinnitus and ear discharge.  Eyes: Negative for blurred vision, double vision, photophobia, pain, discharge and redness.  Respiratory: Negative for cough, hemoptysis, sputum production, shortness of breath, wheezing and stridor.  Cardiovascular: Negative for chest pain, palpitations, orthopnea, claudication, leg swelling and PND.  Gastrointestinal: Negative for abdominal pain. Negative for heartburn, nausea, vomiting, diarrhea, constipation, blood in stool and melena.  Genitourinary: Negative for dysuria, urgency, frequency, hematuria and flank pain.  Musculoskeletal: Negative for myalgias, back pain, joint pain and falls.  Skin: Negative for itching and rash.  Neurological: Negative for dizziness, tingling, tremors, sensory change, speech change, focal weakness, seizures, loss of consciousness, weakness and headaches.  Endo/Heme/Allergies: Negative for environmental allergies and polydipsia. Does not bruise/bleed easily.  Psychiatric/Behavioral: Negative for depression, suicidal ideas, hallucinations, memory loss and substance abuse. The patient is not nervous/anxious and does not  have insomnia.  PHYSICAL EXAM:  Blood pressure 130/76, weight 137 lb (62.143 kg), last menstrual period 11/03/2012.  Vitals reviewed.  Constitutional: She is oriented to person, place, and time. She appears well-developed and well-nourished.  HENT:  Head: Normocephalic and atraumatic.  Right Ear: External ear normal.  Left Ear: External ear normal.  Nose: Nose normal.  Mouth/Throat: Oropharynx is clear and moist.  Eyes: Conjunctivae and EOM are normal. Pupils are equal, round, and reactive to light. Right eye exhibits no discharge. Left eye exhibits no discharge. No scleral icterus.   Neck: Normal range of motion. Neck supple. No tracheal deviation present. No thyromegaly present.  Cardiovascular: Normal rate, regular rhythm, normal heart sounds and intact distal pulses. Exam reveals no gallop and no friction rub.  No murmur heard.  Respiratory: Effort normal and breath sounds normal. No respiratory distress. She has no wheezes. She has no rales. She exhibits no tenderness.  GI: Soft. Bowel sounds are normal. She exhibits no distension and no mass. There is tenderness. There is no rebound and no guarding.  Genitourinary:  Vulva is normal without lesions Vagina is pink moist without discharge Cervix normal in appearance and pap is normal Uterus is normal size shape and contour by sonogram Adnexa is negative with normal sized ovaries by sonogram  Musculoskeletal: Normal range of motion. She exhibits no edema and no tenderness.  Neurological: She is alert and oriented to person, place, and time. She has normal reflexes. She displays normal reflexes. No cranial nerve deficit. She exhibits normal muscle tone. Coordination normal.  Skin: Skin is warm and dry. No rash noted. No erythema. No pallor.  Psychiatric: She has a normal mood and affect. Her behavior is normal. Judgment and thought content normal.  Labs:  Results for orders placed in visit on 11/03/12 (from the past 336 hour(s))   POCT HEMOGLOBIN    Collection Time    11/03/12 2:55 PM   Result  Value  Range    Hemoglobin  12.1 (*)  12.2 - 16.2 g/dL   EKG:  Orders placed during the hospital encounter of 09/30/12   .  ED EKG   .  ED EKG   .  EKG 12-LEAD   .  EKG 12-LEAD   .  EKG   .  EKG   Imaging Studies:  US Breast Left  10/06/2012 *RADIOLOGY REPORT* Clinical Data: The patient was recalled from screening for bilateral breast calcifications and asymmetry within the left axillary region on the left MLO view. DIGITAL DIAGNOSTIC BILATERAL MAMMOGRAM WITH CAD AND LEFT BREAST ULTRASOUND: Comparison: 09/14/2012, baseline  Findings: ACR Breast Density Category c: The breast tissue is heterogeneously dense, which may obscure small masses. Two separate groups of calcifications within the upper-outer quadrant of the right breast were evaluated with spot compression magnification views. The majority of these calcifications appear to layer on the true lateral view, compatible with milk of calcium. Group of calcifications within the upper-outer quadrant of the left breast in the mid to posterior depth appear round on spot compression magnification views. Additionally on the spot compression magnification views is a second smaller group of punctate and round calcifications. Questioned mass within the left axillary region on the left MLO view is less conspicuous on spot compression magnification views however does appear to persist on the true lateral view. This will be further evaluated with ultrasound. Mammographic images were processed with CAD. On physical exam, I palpate no discrete mass within the left axillary region. Ultrasound is performed, showing  no concerning mass in the left axillary region. IMPRESSION: 1. Calcifications within the upper-outer quadrant of the right breast predominately layer on true lateral view, compatible with milk of calcium. 2. Group of round calcifications within the upper-outer quadrant of the left breast with a second smaller group of tiny round and punctate calcifications demonstrated best on spot compression magnification views. 3. Focal asymmetry within the left axillary region without ultrasound correlate is probably benign. RECOMMENDATION: Bilateral spot compression magnification views of the bilateral breast calcifications and additional mammographic views of the focal asymmetry within the left axillary region in 6 months. I have discussed the findings and recommendations with the patient. Results were also provided in writing at the conclusion of the visit. If applicable, a reminder letter will be  sent to the patient regarding the next appointment. BI-RADS CATEGORY 3: Probably benign finding(s) - short interval follow-up suggested. Original Report Authenticated By: Annia Belt, M.D  [facility]  10/06/2012 *RADIOLOGY REPORT* Clinical Data: The patient was recalled from screening for bilateral breast calcifications and asymmetry within the left axillary region on the left MLO view. DIGITAL DIAGNOSTIC BILATERAL MAMMOGRAM WITH CAD AND LEFT BREAST ULTRASOUND: Comparison: 09/14/2012, baseline Findings: ACR Breast Density Category c: The breast tissue is heterogeneously dense, which may obscure small masses. Two separate groups of calcifications within the upper-outer quadrant of the right breast were evaluated with spot compression magnification views. The majority of these calcifications appear to layer on the true lateral view, compatible with milk of calcium. Group of calcifications within the upper-outer quadrant of the left breast in the mid to posterior depth appear round on spot compression magnification views. Additionally on the spot compression magnification views is a second smaller group of punctate and round calcifications. Questioned mass within the left axillary region on the left MLO view is less conspicuous on spot compression magnification views however does appear to persist on the true lateral view. This will be further evaluated with ultrasound. Mammographic images were processed with CAD. On physical exam, I palpate no discrete mass within the left axillary region. Ultrasound is performed, showing no concerning mass in the left axillary region. IMPRESSION: 1. Calcifications within the upper-outer quadrant of the right breast predominately layer on true lateral view, compatible with milk of calcium. 2. Group of round calcifications within the upper-outer quadrant of the left breast with a second smaller group of tiny round and punctate calcifications demonstrated best on spot  compression magnification views. 3. Focal asymmetry within the left axillary region without ultrasound correlate is probably benign. RECOMMENDATION: Bilateral spot compression magnification views of the bilateral breast calcifications and additional mammographic views of the focal asymmetry within the left axillary region in 6 months. I have discussed the findings and recommendations with the patient. Results were also provided in writing at the conclusion of the visit. If applicable, a reminder letter will be sent to the patient regarding the next appointment. BI-RADS CATEGORY 3: Probably benign finding(s) - short interval follow-up suggested. Original Report Authenticated By: Annia Belt, M.D  Results for orders placed during the hospital encounter of 11/10/12 (from the past 336 hour(s))  CBC   Collection Time    11/10/12 11:30 AM      Result Value Range   WBC 6.4  4.0 - 10.5 K/uL   RBC 5.24 (*) 3.87 - 5.11 MIL/uL   Hemoglobin 12.7  12.0 - 15.0 g/dL   HCT 47.8  29.5 - 62.1 %   MCV 74.6 (*) 78.0 - 100.0 fL  MCH 24.2 (*) 26.0 - 34.0 pg   MCHC 32.5  30.0 - 36.0 g/dL   RDW 45.4 (*) 09.8 - 11.9 %   Platelets 163  150 - 400 K/uL  COMPREHENSIVE METABOLIC PANEL   Collection Time    11/10/12 11:30 AM      Result Value Range   Sodium 138  135 - 145 mEq/L   Potassium 4.1  3.5 - 5.1 mEq/L   Chloride 101  96 - 112 mEq/L   CO2 25  19 - 32 mEq/L   Glucose, Bld 108 (*) 70 - 99 mg/dL   BUN 7  6 - 23 mg/dL   Creatinine, Ser 1.47  0.50 - 1.10 mg/dL   Calcium 82.9  8.4 - 56.2 mg/dL   Total Protein 7.8  6.0 - 8.3 g/dL   Albumin 3.8  3.5 - 5.2 g/dL   AST 14  0 - 37 U/L   ALT 10  0 - 35 U/L   Alkaline Phosphatase 62  39 - 117 U/L   Total Bilirubin 0.2 (*) 0.3 - 1.2 mg/dL   GFR calc non Af Amer 80 (*) >90 mL/min   GFR calc Af Amer >90  >90 mL/min  HCG, QUANTITATIVE, PREGNANCY   Collection Time    11/10/12 11:30 AM      Result Value Range   hCG, Beta Chain, Quant, S <1  <5 mIU/mL  URINALYSIS,  ROUTINE W REFLEX MICROSCOPIC   Collection Time    11/10/12 11:36 AM      Result Value Range   Color, Urine YELLOW  YELLOW   APPearance CLEAR  CLEAR   Specific Gravity, Urine 1.010  1.005 - 1.030   pH 6.5  5.0 - 8.0   Glucose, UA NEGATIVE  NEGATIVE mg/dL   Hgb urine dipstick TRACE (*) NEGATIVE   Bilirubin Urine NEGATIVE  NEGATIVE   Ketones, ur NEGATIVE  NEGATIVE mg/dL   Protein, ur NEGATIVE  NEGATIVE mg/dL   Urobilinogen, UA 0.2  0.0 - 1.0 mg/dL   Nitrite NEGATIVE  NEGATIVE   Leukocytes, UA NEGATIVE  NEGATIVE  URINE MICROSCOPIC-ADD ON   Collection Time    11/10/12 11:36 AM      Result Value Range   Squamous Epithelial / LPF RARE  RARE   WBC, UA 0-2  <3 WBC/hpf   RBC / HPF 0-2  <3 RBC/hpf  Results for orders placed in visit on 11/03/12 (from the past 336 hour(s))  POCT HEMOGLOBIN   Collection Time    11/03/12  2:55 PM      Result Value Range   Hemoglobin 12.1 (*) 12.2 - 16.2 g/dL     Assessment:  Patient Active Problem List    Diagnosis  Date Noted   .  Anemia  07/06/2012   .  Menorrhagia with irregular cycle  07/06/2012   .  Abnormal uterine bleeding  11/08/2010   .  HTN (hypertension)  11/08/2010   .  Iron deficiency  11/08/2010   .  Folate deficiency  11/08/2010   .  Melena  11/07/2010   .  Anemia, unspecified  11/06/2010   .  Tobacco abuse  11/06/2010   Plan:  hysterosocpy uterine curettage and endometrial ablation 11/17/2012

## 2012-11-17 NOTE — Anesthesia Preprocedure Evaluation (Signed)
Anesthesia Evaluation  Patient identified by MRN, date of birth, ID band Patient awake    Reviewed: Allergy & Precautions, H&P , NPO status , Patient's Chart, lab work & pertinent test results  Airway Mallampati: I TM Distance: >3 FB     Dental  (+) Edentulous Upper   Pulmonary COPDCurrent Smoker,  breath sounds clear to auscultation        Cardiovascular hypertension, Pt. on medications Rhythm:Regular Rate:Normal     Neuro/Psych    GI/Hepatic GERD-  Medicated,  Endo/Other    Renal/GU      Musculoskeletal   Abdominal   Peds  Hematology   Anesthesia Other Findings   Reproductive/Obstetrics                           Anesthesia Physical Anesthesia Plan  ASA: III  Anesthesia Plan: General   Post-op Pain Management:    Induction: Intravenous and Rapid sequence  Airway Management Planned: Oral ETT  Additional Equipment:   Intra-op Plan:   Post-operative Plan: Extubation in OR  Informed Consent: I have reviewed the patients History and Physical, chart, labs and discussed the procedure including the risks, benefits and alternatives for the proposed anesthesia with the patient or authorized representative who has indicated his/her understanding and acceptance.     Plan Discussed with:   Anesthesia Plan Comments:         Anesthesia Quick Evaluation

## 2012-11-19 ENCOUNTER — Encounter (HOSPITAL_COMMUNITY): Payer: Self-pay | Admitting: Obstetrics & Gynecology

## 2012-11-24 ENCOUNTER — Encounter: Payer: Self-pay | Admitting: Obstetrics & Gynecology

## 2012-11-24 ENCOUNTER — Ambulatory Visit (INDEPENDENT_AMBULATORY_CARE_PROVIDER_SITE_OTHER): Payer: Self-pay | Admitting: Obstetrics & Gynecology

## 2012-11-24 VITALS — BP 150/88 | Ht 63.0 in | Wt 136.5 lb

## 2012-11-24 DIAGNOSIS — Z9889 Other specified postprocedural states: Secondary | ICD-10-CM

## 2012-11-24 NOTE — Progress Notes (Signed)
Patient ID: [name], female   DOB: 1965-06-07, 47 y.o.   MRN: [id number] S/P endometrial ablation last week Doing well no complaints Some cramping still Minimal watery discharge  Exam Minimal discharge Uterus normal post ablation exam  Follow up prn or 1 year

## 2012-12-01 ENCOUNTER — Telehealth: Payer: Self-pay | Admitting: Obstetrics & Gynecology

## 2012-12-01 MED ORDER — HYDROCODONE-ACETAMINOPHEN 5-325 MG PO TABS: 1.0000 | ORAL_TABLET | Freq: Four times a day (QID) | ORAL | Status: DC | PRN

## 2012-12-01 NOTE — Telephone Encounter (Signed)
Pt informed RX for Norco at front desk for pt to pick up

## 2012-12-01 NOTE — Telephone Encounter (Signed)
C/o pain in lower abdomen and lower back. Pt states "hurts worse now than before surgery." Using heating pad with no improvement. Pt states started period yesterday.  Requesting pain medication.

## 2012-12-10 ENCOUNTER — Ambulatory Visit (INDEPENDENT_AMBULATORY_CARE_PROVIDER_SITE_OTHER): Payer: Self-pay | Admitting: Obstetrics & Gynecology

## 2012-12-10 ENCOUNTER — Telehealth: Payer: Self-pay | Admitting: Obstetrics & Gynecology

## 2012-12-10 ENCOUNTER — Encounter: Payer: Self-pay | Admitting: Obstetrics & Gynecology

## 2012-12-10 DIAGNOSIS — Z9889 Other specified postprocedural states: Secondary | ICD-10-CM

## 2012-12-10 NOTE — Telephone Encounter (Signed)
Pt states having heavy bleeding now, surgery endometrial ablation 11/17/2012. Per Cyril Mourning, NP, pt needs to be seen. Pt informed to come on now to be seen. Verbalized understanding.

## 2012-12-10 NOTE — Progress Notes (Signed)
Patient ID: [name], female   DOB: 26-May-1965, 47 y.o.   MRN: [id number] Pt was counselled that she can expect any degree of bleeding the first 3 months after the procedure She now understands folllow up prn but should have no particular expectation until after 1/22

## 2013-01-14 ENCOUNTER — Telehealth: Payer: Self-pay | Admitting: *Deleted

## 2013-01-14 NOTE — Telephone Encounter (Signed)
Spoke with pt. Had ablation in October. Started period today, having a lot of pain. Using a heating pad, taking Tylenol and Advil with no relief. Can you order pain med? Pt aware that she would have to come to office to pick up Rx and would be Monday before that could happen. Please advise. Thanks!!!

## 2013-01-17 MED ORDER — HYDROCODONE-ACETAMINOPHEN 5-325 MG PO TABS: 1.0000 | ORAL_TABLET | Freq: Four times a day (QID) | ORAL | Status: DC | PRN

## 2013-01-17 NOTE — Telephone Encounter (Signed)
Spoke with pt letting her know Rx was at the front desk ready to be picked up. JSY

## 2013-02-22 ENCOUNTER — Telehealth: Payer: Self-pay | Admitting: Obstetrics & Gynecology

## 2013-02-22 MED ORDER — KETOROLAC TROMETHAMINE 10 MG PO TABS: 10.0000 mg | ORAL_TABLET | Freq: Three times a day (TID) | ORAL | Status: DC | PRN

## 2013-02-22 NOTE — Telephone Encounter (Signed)
Spoke with pt. Had ablation in October. Pt woke up with bloody discharge and stomach pain today. Using heating pad. Taking Tylenol and Advil with no help. Pt is requesting something for pain. Uses [facility] in . Thanks!!!

## 2013-02-22 NOTE — Telephone Encounter (Signed)
Left message letting pt know Toradol was sent to pharmacy. JSY

## 2013-03-07 ENCOUNTER — Other Ambulatory Visit (HOSPITAL_COMMUNITY): Payer: Self-pay | Admitting: *Deleted

## 2013-03-07 ENCOUNTER — Other Ambulatory Visit (HOSPITAL_COMMUNITY): Payer: Self-pay | Admitting: Nurse Practitioner

## 2013-03-07 DIAGNOSIS — Z09 Encounter for follow-up examination after completed treatment for conditions other than malignant neoplasm: Secondary | ICD-10-CM

## 2013-03-07 DIAGNOSIS — R921 Mammographic calcification found on diagnostic imaging of breast: Secondary | ICD-10-CM

## 2013-04-06 ENCOUNTER — Ambulatory Visit (HOSPITAL_COMMUNITY)
Admission: RE | Admit: 2013-04-06 | Discharge: 2013-04-06 | Disposition: A | Discharge status: Home / Self Care | Payer: PRIVATE HEALTH INSURANCE | Source: Ambulatory Visit | Attending: *Deleted | Admitting: *Deleted

## 2013-04-06 ENCOUNTER — Encounter (HOSPITAL_COMMUNITY): Payer: Self-pay

## 2013-04-06 DIAGNOSIS — Z1231 Encounter for screening mammogram for malignant neoplasm of breast: Secondary | ICD-10-CM | POA: Insufficient documentation

## 2013-04-06 DIAGNOSIS — Z09 Encounter for follow-up examination after completed treatment for conditions other than malignant neoplasm: Secondary | ICD-10-CM

## 2013-04-06 DIAGNOSIS — R921 Mammographic calcification found on diagnostic imaging of breast: Secondary | ICD-10-CM

## 2013-04-18 ENCOUNTER — Telehealth: Payer: Self-pay

## 2013-04-18 NOTE — Telephone Encounter (Signed)
Pt states had 2 periods this month after having endometrial ablation in 10/2012. Pt states menstrual flow med to heavy and did not think she was to have a cycle. Call transferred to front staff for an appt to be made with Dr. Despina HiddenEure.

## 2013-04-20 ENCOUNTER — Encounter (INDEPENDENT_AMBULATORY_CARE_PROVIDER_SITE_OTHER): Payer: Self-pay

## 2013-04-20 ENCOUNTER — Ambulatory Visit: Payer: Self-pay | Admitting: Obstetrics & Gynecology

## 2013-09-27 ENCOUNTER — Other Ambulatory Visit (HOSPITAL_COMMUNITY): Payer: Self-pay | Admitting: *Deleted

## 2013-09-27 DIAGNOSIS — Z09 Encounter for follow-up examination after completed treatment for conditions other than malignant neoplasm: Secondary | ICD-10-CM

## 2013-10-11 ENCOUNTER — Other Ambulatory Visit: Payer: Self-pay | Admitting: *Deleted

## 2013-10-11 ENCOUNTER — Ambulatory Visit (HOSPITAL_COMMUNITY)
Admission: RE | Admit: 2013-10-11 | Discharge: 2013-10-11 | Disposition: A | Discharge status: Home / Self Care | Payer: PRIVATE HEALTH INSURANCE | Source: Ambulatory Visit | Attending: *Deleted | Admitting: *Deleted

## 2013-10-11 DIAGNOSIS — R921 Mammographic calcification found on diagnostic imaging of breast: Secondary | ICD-10-CM

## 2013-10-11 DIAGNOSIS — R928 Other abnormal and inconclusive findings on diagnostic imaging of breast: Secondary | ICD-10-CM | POA: Insufficient documentation

## 2013-10-11 DIAGNOSIS — Z09 Encounter for follow-up examination after completed treatment for conditions other than malignant neoplasm: Secondary | ICD-10-CM | POA: Diagnosis not present

## 2013-10-18 ENCOUNTER — Inpatient Hospital Stay: Admission: RE | Admit: 2013-10-18 | Payer: Self-pay | Source: Ambulatory Visit

## 2013-11-28 ENCOUNTER — Encounter: Payer: Self-pay | Admitting: Obstetrics & Gynecology

## 2013-12-06 ENCOUNTER — Emergency Department (HOSPITAL_COMMUNITY)
Admission: EM | Admit: 2013-12-06 | Discharge: 2013-12-06 | Disposition: A | Discharge status: Home / Self Care | Payer: Self-pay | Attending: Emergency Medicine | Admitting: Emergency Medicine

## 2013-12-06 ENCOUNTER — Encounter (HOSPITAL_COMMUNITY): Payer: Self-pay | Admitting: Cardiology

## 2013-12-06 ENCOUNTER — Emergency Department (HOSPITAL_COMMUNITY): Payer: Self-pay

## 2013-12-06 DIAGNOSIS — K219 Gastro-esophageal reflux disease without esophagitis: Secondary | ICD-10-CM | POA: Insufficient documentation

## 2013-12-06 DIAGNOSIS — E876 Hypokalemia: Secondary | ICD-10-CM

## 2013-12-06 DIAGNOSIS — J440 Chronic obstructive pulmonary disease with acute lower respiratory infection: Secondary | ICD-10-CM | POA: Insufficient documentation

## 2013-12-06 DIAGNOSIS — R0789 Other chest pain: Secondary | ICD-10-CM | POA: Insufficient documentation

## 2013-12-06 DIAGNOSIS — Z79899 Other long term (current) drug therapy: Secondary | ICD-10-CM | POA: Insufficient documentation

## 2013-12-06 DIAGNOSIS — Z862 Personal history of diseases of the blood and blood-forming organs and certain disorders involving the immune mechanism: Secondary | ICD-10-CM | POA: Insufficient documentation

## 2013-12-06 DIAGNOSIS — Z720 Tobacco use: Secondary | ICD-10-CM | POA: Insufficient documentation

## 2013-12-06 DIAGNOSIS — R079 Chest pain, unspecified: Secondary | ICD-10-CM

## 2013-12-06 DIAGNOSIS — I10 Essential (primary) hypertension: Secondary | ICD-10-CM | POA: Insufficient documentation

## 2013-12-06 DIAGNOSIS — F419 Anxiety disorder, unspecified: Secondary | ICD-10-CM

## 2013-12-06 HISTORY — DX: Essential (primary) hypertension: I10

## 2013-12-06 LAB — CBC WITH DIFFERENTIAL/PLATELET
BASOS PCT: 0 % (ref 0–1)
Basophils Absolute: 0 10*3/uL (ref 0.0–0.1)
Eosinophils Absolute: 0.1 10*3/uL (ref 0.0–0.7)
Eosinophils Relative: 1 % (ref 0–5)
HCT: 35.9 % — ABNORMAL LOW (ref 36.0–46.0)
HEMOGLOBIN: 11.7 g/dL — AB (ref 12.0–15.0)
Lymphocytes Relative: 24 % (ref 12–46)
Lymphs Abs: 2.1 10*3/uL (ref 0.7–4.0)
MCH: 25.1 pg — ABNORMAL LOW (ref 26.0–34.0)
MCHC: 32.6 g/dL (ref 30.0–36.0)
MCV: 76.9 fL — ABNORMAL LOW (ref 78.0–100.0)
MONOS PCT: 8 % (ref 3–12)
Monocytes Absolute: 0.7 10*3/uL (ref 0.1–1.0)
NEUTROS ABS: 6 10*3/uL (ref 1.7–7.7)
NEUTROS PCT: 67 % (ref 43–77)
Platelets: 104 10*3/uL — ABNORMAL LOW (ref 150–400)
RBC: 4.67 MIL/uL (ref 3.87–5.11)
RDW: 17.8 % — ABNORMAL HIGH (ref 11.5–15.5)
WBC: 9 10*3/uL (ref 4.0–10.5)

## 2013-12-06 LAB — I-STAT CHEM 8, ED
BUN: <3 mg/dL — ABNORMAL LOW (ref 6–23)
CHLORIDE: 110 meq/L (ref 96–112)
Calcium, Ion: 1.18 mmol/L (ref 1.12–1.23)
Creatinine, Ser: 0.8 mg/dL (ref 0.50–1.10)
Glucose, Bld: 99 mg/dL (ref 70–99)
HEMATOCRIT: 35 % — AB (ref 36.0–46.0)
Hemoglobin: 11.9 g/dL — ABNORMAL LOW (ref 12.0–15.0)
POTASSIUM: 3.1 meq/L — AB (ref 3.7–5.3)
Sodium: 142 mEq/L (ref 137–147)
TCO2: 20 mmol/L (ref 0–100)

## 2013-12-06 LAB — I-STAT TROPONIN, ED: Troponin i, poc: 0 ng/mL (ref 0.00–0.08)

## 2013-12-06 MED ORDER — ONDANSETRON HCL 4 MG/2ML IJ SOLN
4.0000 mg | Freq: Once | INTRAMUSCULAR | Status: AC
  Administered 2013-12-06: 4 mg via INTRAVENOUS
  Filled 2013-12-06: qty 2

## 2013-12-06 MED ORDER — MORPHINE SULFATE 4 MG/ML IJ SOLN
4.0000 mg | Freq: Once | INTRAMUSCULAR | Status: AC
  Administered 2013-12-06: 4 mg via INTRAVENOUS
  Filled 2013-12-06: qty 1

## 2013-12-06 NOTE — Discharge Instructions (Signed)
Follow up with your doctor or return here for continued or worsening symptoms Chest Pain (Nonspecific) It is often hard to give a specific diagnosis for the cause of chest pain. There is always a chance that your pain could be related to something serious, such as a heart attack or a blood clot in the lungs. You need to follow up with your health care provider for further evaluation. CAUSES   Heartburn.  Pneumonia or bronchitis.  Anxiety or stress.  Inflammation around your heart (pericarditis) or lung (pleuritis or pleurisy).  A blood clot in the lung.  A collapsed lung (pneumothorax). It can develop suddenly on its own (spontaneous pneumothorax) or from trauma to the chest.  Shingles infection (herpes zoster virus). The chest wall is composed of bones, muscles, and cartilage. Any of these can be the source of the pain.  The bones can be bruised by injury.  The muscles or cartilage can be strained by coughing or overwork.  The cartilage can be affected by inflammation and become sore (costochondritis). DIAGNOSIS  Lab tests or other studies may be needed to find the cause of your pain. Your health care provider may have you take a test called an ambulatory electrocardiogram (ECG). An ECG records your heartbeat patterns over a 24-hour period. You may also have other tests, such as:  Transthoracic echocardiogram (TTE). During echocardiography, sound waves are used to evaluate how blood flows through your heart.  Transesophageal echocardiogram (TEE).  Cardiac monitoring. This allows your health care provider to monitor your heart rate and rhythm in real time.  Holter monitor. This is a portable device that records your heartbeat and can help diagnose heart arrhythmias. It allows your health care provider to track your heart activity for several days, if needed.  Stress tests by exercise or by giving medicine that makes the heart beat faster. TREATMENT   Treatment depends on what  may be causing your chest pain. Treatment may include:  Acid blockers for heartburn.  Anti-inflammatory medicine.  Pain medicine for inflammatory conditions.  Antibiotics if an infection is present.  You may be advised to change lifestyle habits. This includes stopping smoking and avoiding alcohol, caffeine, and chocolate.  You may be advised to keep your head raised (elevated) when sleeping. This reduces the chance of acid going backward from your stomach into your esophagus. Most of the time, nonspecific chest pain will improve within 2-3 days with rest and mild pain medicine.  HOME CARE INSTRUCTIONS   If antibiotics were prescribed, take them as directed. Finish them even if you start to feel better.  For the next few days, avoid physical activities that bring on chest pain. Continue physical activities as directed.  Do not use any tobacco products, including cigarettes, chewing tobacco, or electronic cigarettes.  Avoid drinking alcohol.  Only take medicine as directed by your health care provider.  Follow your health care provider's suggestions for further testing if your chest pain does not go away.  Keep any follow-up appointments you made. If you do not go to an appointment, you could develop lasting (chronic) problems with pain. If there is any problem keeping an appointment, call to reschedule. SEEK MEDICAL CARE IF:   Your chest pain does not go away, even after treatment.  You have a rash with blisters on your chest.  You have a fever. SEEK IMMEDIATE MEDICAL CARE IF:   You have increased chest pain or pain that spreads to your arm, neck, jaw, back, or abdomen.  You  have shortness of breath.  You have an increasing cough, or you cough up blood.  You have severe back or abdominal pain.  You feel nauseous or vomit.  You have severe weakness.  You faint.  You have chills. This is an emergency. Do not wait to see if the pain will go away. Get medical help at  once. Call your local emergency services (911 in [place] Do not drive yourself to the hospital. MAKE SURE YOU:   Understand these instructions.  Will watch your condition.  Will get help right away if you are not doing well or get worse. Document Released: 10/23/2004 Document Revised: 01/18/2013 Document Reviewed: 08/19/2007 [facility] Patient Information 2015 [employer], [place]. This information is not intended to replace advice given to you by your health care provider. Make sure you discuss any questions you have with your health care provider.

## 2013-12-06 NOTE — ED Provider Notes (Signed)
CSN: [id number]     Arrival date & time 12/06/13  0754 History   First MD Initiated Contact with Patient 12/06/13 [phone]     Chief Complaint  Patient presents with  . Chest Pain     (Consider location/radiation/quality/duration/timing/severity/associated sxs/prior Treatment) HPI Comments: Pt comes in with upper chest pain that started this morning around 5 am. Pt state that it hurts worse with breathing and movement. Pain goes to her back. Denies sob. States that she has had a cough. She states that she hasn't been able to smoke this morning because of the pain when she breathes. No vomiting, or diaphoresis. She state that she also has a headache. Hasn't taken any medications for the symptoms. She states that this has been under a lot of stress regarding her daughter and grand children. Has htn.  The history is provided by the patient. No language interpreter was used.    Past Medical History  Diagnosis Date  . COPD (chronic obstructive pulmonary disease)   . Anemia   . Pleurisy   . GERD (gastroesophageal reflux disease)   . Hypertension    Past Surgical History  Procedure Laterality Date  . Tubal ligation    . Cyst removed from right face    . Esophagogastroduodenoscopy  11/07/2010    Procedure: ESOPHAGOGASTRODUODENOSCOPY (EGD);  Surgeon: Corbin Adeobert M Rourk, MD;  Location: [facility];  Service: Endoscopy;  Laterality: N/A;  . Dilitation & currettage/hystroscopy with thermachoice ablation N/A 11/17/2012    Procedure: DILATATION & CURETTAGE/HYSTEROSCOPY WITH THERMACHOICE ABLATION;  Surgeon: Lazaro ArmsLuther H Eure, MD;  Location: [facility];  Service: Gynecology;  Laterality: N/A;  Total D5 in 21ml, total D5 out 21ml; temp 86-87 degrees Celcius; total time 9 minutes and 36 seconds;    Family History  Problem Relation Age of Onset  . Colon cancer Father 7440    deceased secondary to kidney failure  . Diabetes Father   . Colon cancer Brother 2942    recent diagnosis (2012). Finishing chemotherapy.  .  Breast cancer Maternal Aunt   . Diabetes Mother   . Diabetes Brother   . Hypertension Brother   . Diabetes Maternal Grandmother    History  Substance Use Topics  . Smoking status: Current Every Day Smoker -- 0.50 packs/day for 20 years    Types: Cigarettes  . Smokeless tobacco: Never Used  . Alcohol Use: No   OB History    Gravida Para Term Preterm AB TAB SAB Ectopic Multiple Living   2 2 2       2      Review of Systems  All other systems reviewed and are negative.     Allergies  Review of patient's allergies indicates no known allergies.  Home Medications   Prior to Admission medications   Medication Sig Start Date End Date Taking? Authorizing Provider  lisinopril (PRINIVIL,ZESTRIL) 2.5 MG tablet Take 2.5 mg by mouth daily.   Yes Historical Provider, MD  omeprazole (PRILOSEC) 20 MG capsule Take 20 mg by mouth daily.   Yes Historical Provider, MD  acetaminophen (TYLENOL) 500 MG tablet Take 500 mg by mouth every 6 (six) hours as needed for pain.    Historical Provider, MD  HYDROcodone-acetaminophen (NORCO/VICODIN) 5-325 MG per tablet Take 1 tablet by mouth every 6 (six) hours as needed. Patient not taking: Reported on 12/06/2013 01/17/13   Lazaro ArmsLuther H Eure, MD  ketorolac (TORADOL) 10 MG tablet Take 1 tablet (10 mg total) by mouth every 8 (eight) hours as needed. Patient  not taking: Reported on 12/06/2013 02/22/13   Lazaro ArmsLuther H Eure, MD  ondansetron (ZOFRAN) 8 MG tablet Take 1 tablet (8 mg total) by mouth every 8 (eight) hours as needed for nausea. Patient not taking: Reported on 12/06/2013 11/17/12   Lazaro ArmsLuther H Eure, MD   BP 124/93 mmHg  Pulse 79  Temp(Src) 98.2 F (36.8 C) (Oral)  Resp 18  Ht 5\' 3"  (1.6 m)  Wt 145 lb (65.772 kg)  BMI 25.69 kg/m2  SpO2 100%  LMP 11/23/2013 Physical Exam  Constitutional: She is oriented to person, place, and time. She appears well-developed and well-nourished.  HENT:  Head: Normocephalic and atraumatic.  Cardiovascular: Normal rate and  regular rhythm.   Pulmonary/Chest: Effort normal and breath sounds normal.  Generalized chest wall tenderness  Abdominal: Soft. Bowel sounds are normal. There is no tenderness.  Musculoskeletal: Normal range of motion.  Neurological: She is alert and oriented to person, place, and time.  Skin: Skin is warm and dry.  Psychiatric: She has a normal mood and affect.  Nursing note and vitals reviewed.   ED Course  Procedures (including critical care time) Labs Review Labs Reviewed  CBC WITH DIFFERENTIAL - Abnormal; Notable for the following:    Hemoglobin 11.7 (*)    HCT 35.9 (*)    MCV 76.9 (*)    MCH 25.1 (*)    RDW 17.8 (*)    Platelets 104 (*)    All other components within normal limits  I-STAT CHEM 8, ED - Abnormal; Notable for the following:    Potassium 3.1 (*)    BUN <3 (*)    Hemoglobin 11.9 (*)    HCT 35.0 (*)    All other components within normal limits  I-STAT TROPOININ, ED    Imaging Review Dg Chest 2 View  12/06/2013   CLINICAL DATA:  Chest pain with nonproductive cough and difficulty breathing for 1 day  EXAM: CHEST  2 VIEW  COMPARISON:  September 30, 2012  FINDINGS: There is slight scarring in the left base. Elsewhere lungs are clear. Heart size and pulmonary vascularity are normal. No adenopathy. No pneumothorax. No bone lesions.  IMPRESSION: No edema or consolidation.  Slight scarring left base.   Electronically Signed   By: Bretta BangWilliam  Woodruff M.D.   On: 12/06/2013 09:26     Date: 12/06/2013  Rate: 48  Rhythm: normal sinus rhythm  QRS Axis: normal  Intervals: normal  ST/T Wave abnormalities: normal  Conduction Disutrbances:none  Narrative Interpretation:   Old EKG Reviewed: none available    MDM   Final diagnoses:  Chest pain    Considered pe although pt is perc and wells negative. Doubt acs. Pt is comfortable at this time after a dose of morphine. No pneumonia noted on x-ray. Pt is okay with plan to go home and return as needed    Teressa LowerVrinda  Primo Innis, NP 12/06/13 1107  Lyanne CoKevin M Campos, MD 12/06/13 [phone]

## 2013-12-06 NOTE — ED Notes (Signed)
Pt was woke this morning around 5 am with pain when she breathes in her chest and back.  Also c/o headache.

## 2014-02-09 ENCOUNTER — Emergency Department (HOSPITAL_COMMUNITY)
Admission: EM | Admit: 2014-02-09 | Discharge: 2014-02-09 | Disposition: A | Discharge status: Home / Self Care | Payer: Self-pay | Attending: Emergency Medicine | Admitting: Emergency Medicine

## 2014-02-09 ENCOUNTER — Emergency Department (HOSPITAL_COMMUNITY): Payer: Self-pay

## 2014-02-09 ENCOUNTER — Encounter (HOSPITAL_COMMUNITY): Payer: Self-pay | Admitting: Emergency Medicine

## 2014-02-09 DIAGNOSIS — J449 Chronic obstructive pulmonary disease, unspecified: Secondary | ICD-10-CM | POA: Insufficient documentation

## 2014-02-09 DIAGNOSIS — R519 Headache, unspecified: Secondary | ICD-10-CM

## 2014-02-09 DIAGNOSIS — R42 Dizziness and giddiness: Secondary | ICD-10-CM | POA: Insufficient documentation

## 2014-02-09 DIAGNOSIS — R531 Weakness: Secondary | ICD-10-CM | POA: Insufficient documentation

## 2014-02-09 DIAGNOSIS — Z720 Tobacco use: Secondary | ICD-10-CM | POA: Insufficient documentation

## 2014-02-09 DIAGNOSIS — Z862 Personal history of diseases of the blood and blood-forming organs and certain disorders involving the immune mechanism: Secondary | ICD-10-CM | POA: Insufficient documentation

## 2014-02-09 DIAGNOSIS — R51 Headache: Secondary | ICD-10-CM | POA: Insufficient documentation

## 2014-02-09 DIAGNOSIS — I10 Essential (primary) hypertension: Secondary | ICD-10-CM | POA: Insufficient documentation

## 2014-02-09 DIAGNOSIS — Z79899 Other long term (current) drug therapy: Secondary | ICD-10-CM | POA: Insufficient documentation

## 2014-02-09 DIAGNOSIS — K219 Gastro-esophageal reflux disease without esophagitis: Secondary | ICD-10-CM | POA: Insufficient documentation

## 2014-02-09 LAB — CBC WITH DIFFERENTIAL/PLATELET
Basophils Absolute: 0.1 10*3/uL (ref 0.0–0.1)
Basophils Relative: 1 % (ref 0–1)
Eosinophils Absolute: 0.4 10*3/uL (ref 0.0–0.7)
Eosinophils Relative: 6 % — ABNORMAL HIGH (ref 0–5)
HCT: 39.4 % (ref 36.0–46.0)
Hemoglobin: 12.5 g/dL (ref 12.0–15.0)
Lymphocytes Relative: 42 % (ref 12–46)
Lymphs Abs: 2.8 10*3/uL (ref 0.7–4.0)
MCH: 24.5 pg — ABNORMAL LOW (ref 26.0–34.0)
MCHC: 31.7 g/dL (ref 30.0–36.0)
MCV: 77.3 fL — ABNORMAL LOW (ref 78.0–100.0)
Monocytes Absolute: 0.5 10*3/uL (ref 0.1–1.0)
Monocytes Relative: 7 % (ref 3–12)
Neutro Abs: 2.8 10*3/uL (ref 1.7–7.7)
Neutrophils Relative %: 44 % (ref 43–77)
Platelets: 143 10*3/uL — ABNORMAL LOW (ref 150–400)
RBC: 5.1 MIL/uL (ref 3.87–5.11)
RDW: 16.6 % — ABNORMAL HIGH (ref 11.5–15.5)
WBC: 6.6 10*3/uL (ref 4.0–10.5)

## 2014-02-09 LAB — URINE MICROSCOPIC-ADD ON

## 2014-02-09 LAB — COMPREHENSIVE METABOLIC PANEL
ALT: 11 U/L (ref 0–35)
AST: 15 U/L (ref 0–37)
Albumin: 3.8 g/dL (ref 3.5–5.2)
Alkaline Phosphatase: 70 U/L (ref 39–117)
Anion gap: 6 (ref 5–15)
BUN: 6 mg/dL (ref 6–23)
CO2: 22 mmol/L (ref 19–32)
Calcium: 8.6 mg/dL (ref 8.4–10.5)
Chloride: 109 mEq/L (ref 96–112)
Creatinine, Ser: 0.88 mg/dL (ref 0.50–1.10)
GFR calc Af Amer: 89 mL/min — ABNORMAL LOW (ref >90)
GFR calc non Af Amer: 76 mL/min — ABNORMAL LOW (ref >90)
Glucose, Bld: 101 mg/dL — ABNORMAL HIGH (ref 70–99)
Potassium: 3.1 mmol/L — ABNORMAL LOW (ref 3.5–5.1)
Sodium: 137 mmol/L (ref 135–145)
Total Bilirubin: 0.2 mg/dL — ABNORMAL LOW (ref 0.3–1.2)
Total Protein: 7 g/dL (ref 6.0–8.3)

## 2014-02-09 LAB — URINALYSIS, ROUTINE W REFLEX MICROSCOPIC
Bilirubin Urine: NEGATIVE
Glucose, UA: NEGATIVE mg/dL
Ketones, ur: NEGATIVE mg/dL
Leukocytes, UA: NEGATIVE
Nitrite: NEGATIVE
Protein, ur: NEGATIVE mg/dL
Specific Gravity, Urine: 1.015 (ref 1.005–1.030)
Urobilinogen, UA: 0.2 mg/dL (ref 0.0–1.0)
pH: 6 (ref 5.0–8.0)

## 2014-02-09 MED ORDER — KETOROLAC TROMETHAMINE 30 MG/ML IJ SOLN
30.0000 mg | Freq: Once | INTRAMUSCULAR | Status: AC
  Administered 2014-02-09: 30 mg via INTRAVENOUS
  Filled 2014-02-09: qty 1

## 2014-02-09 MED ORDER — POTASSIUM CHLORIDE CRYS ER 20 MEQ PO TBCR
40.0000 meq | EXTENDED_RELEASE_TABLET | Freq: Once | ORAL | Status: AC
  Administered 2014-02-09: 40 meq via ORAL
  Filled 2014-02-09: qty 2

## 2014-02-09 MED ORDER — PROCHLORPERAZINE EDISYLATE 5 MG/ML IJ SOLN
10.0000 mg | Freq: Once | INTRAMUSCULAR | Status: AC
  Administered 2014-02-09: 10 mg via INTRAVENOUS
  Filled 2014-02-09: qty 2

## 2014-02-09 MED ORDER — SODIUM CHLORIDE 0.9 % IV BOLUS (SEPSIS)
1000.0000 mL | Freq: Once | INTRAVENOUS | Status: AC
  Administered 2014-02-09: 1000 mL via INTRAVENOUS

## 2014-02-09 MED ORDER — DIPHENHYDRAMINE HCL 50 MG/ML IJ SOLN
25.0000 mg | Freq: Once | INTRAMUSCULAR | Status: AC
  Administered 2014-02-09: 25 mg via INTRAVENOUS
  Filled 2014-02-09: qty 1

## 2014-02-09 NOTE — ED Notes (Signed)
Pt has felt dizzy and weak for last 4 days, unsure if medication is causing this, just does not feel well, headache with fog feeling.

## 2014-02-09 NOTE — Discharge Instructions (Signed)
Dizziness °Dizziness is a common problem. It is a feeling of unsteadiness or light-headedness. You may feel like you are about to faint. Dizziness can lead to injury if you stumble or fall. A person of any age group can suffer from dizziness, but dizziness is more common in older adults. °CAUSES  °Dizziness can be caused by many different things, including: °· Middle ear problems. °· Standing for too long. °· Infections. °· An allergic reaction. °· Aging. °· An emotional response to something, such as the sight of blood. °· Side effects of medicines. °· Tiredness. °· Problems with circulation or blood pressure. °· Excessive use of alcohol or medicines, or illegal drug use. °· Breathing too fast (hyperventilation). °· An irregular heart rhythm (arrhythmia). °· A low red blood cell count (anemia). °· Pregnancy. °· Vomiting, diarrhea, fever, or other illnesses that cause body fluid loss (dehydration). °· Diseases or conditions such as Parkinson's disease, high blood pressure (hypertension), diabetes, and thyroid problems. °· Exposure to extreme heat. °DIAGNOSIS  °Your health care provider will ask about your symptoms, perform a physical exam, and perform an electrocardiogram (ECG) to record the electrical activity of your heart. Your health care provider may also perform other heart or blood tests to determine the cause of your dizziness. These may include: °· Transthoracic echocardiogram (TTE). During echocardiography, sound waves are used to evaluate how blood flows through your heart. °· Transesophageal echocardiogram (TEE). °· Cardiac monitoring. This allows your health care provider to monitor your heart rate and rhythm in real time. °· Holter monitor. This is a portable device that records your heartbeat and can help diagnose heart arrhythmias. It allows your health care provider to track your heart activity for several days if needed. °· Stress tests by exercise or by giving medicine that makes the heart beat  faster. °TREATMENT  °Treatment of dizziness depends on the cause of your symptoms and can vary greatly. °HOME CARE INSTRUCTIONS  °· Drink enough fluids to keep your urine clear or pale yellow. This is especially important in very hot weather. In older adults, it is also important in cold weather. °· Take your medicine exactly as directed if your dizziness is caused by medicines. When taking blood pressure medicines, it is especially important to get up slowly. °· Rise slowly from chairs and steady yourself until you feel okay. °· In the morning, first sit up on the side of the bed. When you feel okay, stand slowly while holding onto something until you know your balance is fine. °· Move your legs often if you need to stand in one place for a long time. Tighten and relax your muscles in your legs while standing. °· Have someone stay with you for 1-2 days if dizziness continues to be a problem. Do this until you feel you are well enough to stay alone. Have the person call your health care provider if he or she notices changes in you that are concerning. °· Do not drive or use heavy machinery if you feel dizzy. °· Do not drink alcohol. °SEEK IMMEDIATE MEDICAL CARE IF:  °· Your dizziness or light-headedness gets worse. °· You feel nauseous or vomit. °· You have problems talking, walking, or using your arms, hands, or legs. °· You feel weak. °· You are not thinking clearly or you have trouble forming sentences. It may take a friend or family member to notice this. °· You have chest pain, abdominal pain, shortness of breath, or sweating. °· Your vision changes. °· You notice   any bleeding.  You have side effects from medicine that seems to be getting worse rather than better. MAKE SURE YOU:   Understand these instructions.  Will watch your condition.  Will get help right away if you are not doing well or get worse. Document Released: 07/09/2000 Document Revised: 01/18/2013 Document Reviewed: 08/02/2010 [facility]  Patient Information 2015 [place], [place]. This information is not intended to replace advice given to you by your health care provider. Make sure you discuss any questions you have with your health care provider.  General Headache Without Cause A headache is pain or discomfort felt around the head or neck area. The specific cause of a headache may not be found. There are many causes and types of headaches. A few common ones are:  Tension headaches.  Migraine headaches.  Cluster headaches.  Chronic daily headaches. HOME CARE INSTRUCTIONS   Keep all follow-up appointments with your caregiver or any specialist referral.  Only take over-the-counter or prescription medicines for pain or discomfort as directed by your caregiver.  Lie down in a dark, quiet room when you have a headache.  Keep a headache journal to find out what may trigger your migraine headaches. For example, write down:  What you eat and drink.  How much sleep you get.  Any change to your diet or medicines.  Try massage or other relaxation techniques.  Put ice packs or heat on the head and neck. Use these 3 to 4 times per day for 15 to 20 minutes each time, or as needed.  Limit stress.  Sit up straight, and do not tense your muscles.  Quit smoking if you smoke.  Limit alcohol use.  Decrease the amount of caffeine you drink, or stop drinking caffeine.  Eat and sleep on a regular schedule.  Get 7 to 9 hours of sleep, or as recommended by your caregiver.  Keep lights dim if bright lights bother you and make your headaches worse. SEEK MEDICAL CARE IF:   You have problems with the medicines you were prescribed.  Your medicines are not working.  You have a change from the usual headache.  You have nausea or vomiting. SEEK IMMEDIATE MEDICAL CARE IF:   Your headache becomes severe.  You have a fever.  You have a stiff neck.  You have loss of vision.  You have muscular weakness or loss of muscle  control.  You start losing your balance or have trouble walking.  You feel faint or pass out.  You have severe symptoms that are different from your first symptoms. MAKE SURE YOU:   Understand these instructions.  Will watch your condition.  Will get help right away if you are not doing well or get worse. Document Released: 01/13/2005 Document Revised: 04/07/2011 Document Reviewed: 01/29/2011 [facility] Patient Information 2015 [place], [place]. This information is not intended to replace advice given to you by your health care provider. Make sure you discuss any questions you have with your health care provider.  Weakness Weakness is a lack of strength. It may be felt all over the body (generalized) or in one specific part of the body (focal). Some causes of weakness can be serious. You may need further medical evaluation, especially if you are elderly or you have a history of immunosuppression (such as chemotherapy or HIV), kidney disease, heart disease, or diabetes. CAUSES  Weakness can be caused by many different things, including:  Infection.  Physical exhaustion.  Internal bleeding or other blood loss that results in a  lack of red blood cells (anemia).  Dehydration. This cause is more common in elderly people.  Side effects or electrolyte abnormalities from medicines, such as pain medicines or sedatives.  Emotional distress, anxiety, or depression.  Circulation problems, especially severe peripheral arterial disease.  Heart disease, such as rapid atrial fibrillation, bradycardia, or heart failure.  Nervous system disorders, such as Guillain-Barr syndrome, multiple sclerosis, or stroke. DIAGNOSIS  To find the cause of your weakness, your caregiver will take your history and perform a physical exam. Lab tests or X-rays may also be ordered, if needed. TREATMENT  Treatment of weakness depends on the cause of your symptoms and can vary greatly. HOME CARE INSTRUCTIONS    Rest as needed.  Eat a well-balanced diet.  Try to get some exercise every day.  Only take over-the-counter or prescription medicines as directed by your caregiver. SEEK MEDICAL CARE IF:   Your weakness seems to be getting worse or spreads to other parts of your body.  You develop new aches or pains. SEEK IMMEDIATE MEDICAL CARE IF:   You cannot perform your normal daily activities, such as getting dressed and feeding yourself.  You cannot walk up and down stairs, or you feel exhausted when you do so.  You have shortness of breath or chest pain.  You have difficulty moving parts of your body.  You have weakness in only one area of the body or on only one side of the body.  You have a fever.  You have trouble speaking or swallowing.  You cannot control your bladder or bowel movements.  You have black or bloody vomit or stools. MAKE SURE YOU:  Understand these instructions.  Will watch your condition.  Will get help right away if you are not doing well or get worse. Document Released: 01/13/2005 Document Revised: 07/15/2011 Document Reviewed: 03/14/2011 [facility] Patient Information 2015 [place], [place]. This information is not intended to replace advice given to you by your health care provider. Make sure you discuss any questions you have with your health care provider.

## 2014-02-09 NOTE — ED Provider Notes (Signed)
CSN: [id number]     Arrival date & time 02/09/14  1441 History  This chart was scribed for Gilda Creasehristopher J. Pollina, * by Ronney LionSuzanne Le, ED Scribe. This patient was seen in room [facility] and the patient's care was started at 3:18 PM.    Chief Complaint  Patient presents with  . Dizziness   The history is provided by the patient. No language interpreter was used.     HPI Comments: [name] is a 49 y.o. female who presents to the Emergency Department complaining of dizziness that began 4 days ago. Patient describes her dizziness as similar to feeling "drunk" and like she has "a chemical imbalance" in her head. She complains of associated headache and weakness, as if her iron level is low and she needs blood. She states she has had 3 blood transfusions for heavy menstrual bleeds before. Patient had an ablation done over 1 year ago that stopped bleeding for a couple months, but she still bleeds heavily. She states she has taken hypertension medication for the last 3 months.  Past Medical History  Diagnosis Date  . COPD (chronic obstructive pulmonary disease)   . Anemia   . Pleurisy   . GERD (gastroesophageal reflux disease)   . Hypertension    Past Surgical History  Procedure Laterality Date  . Tubal ligation    . Cyst removed from right face    . Esophagogastroduodenoscopy  11/07/2010    Procedure: ESOPHAGOGASTRODUODENOSCOPY (EGD);  Surgeon: Corbin Adeobert M Rourk, MD;  Location: [facility];  Service: Endoscopy;  Laterality: N/A;  . Dilitation & currettage/hystroscopy with thermachoice ablation N/A 11/17/2012    Procedure: DILATATION & CURETTAGE/HYSTEROSCOPY WITH THERMACHOICE ABLATION;  Surgeon: Lazaro ArmsLuther H Eure, MD;  Location: [facility];  Service: Gynecology;  Laterality: N/A;  Total D5 in 21ml, total D5 out 21ml; temp 86-87 degrees Celcius; total time 9 minutes and 36 seconds;    Family History  Problem Relation Age of Onset  . Colon cancer Father 8240    deceased secondary to kidney failure   . Diabetes Father   . Colon cancer Brother 6542    recent diagnosis (2012). Finishing chemotherapy.  . Breast cancer Maternal Aunt   . Diabetes Mother   . Diabetes Brother   . Hypertension Brother   . Diabetes Maternal Grandmother    History  Substance Use Topics  . Smoking status: Current Every Day Smoker -- 0.50 packs/day for 20 years    Types: Cigarettes  . Smokeless tobacco: Never Used  . Alcohol Use: No   OB History    Gravida Para Term Preterm AB TAB SAB Ectopic Multiple Living   2 2 2       2      Review of Systems  Neurological: Positive for dizziness, weakness and headaches.  All other systems reviewed and are negative.     Allergies  Review of patient's allergies indicates no known allergies.  Home Medications   Prior to Admission medications   Medication Sig Start Date End Date Taking? Authorizing Provider  acetaminophen (TYLENOL) 500 MG tablet Take 500 mg by mouth every 6 (six) hours as needed for pain.   Yes Historical Provider, MD  Aspirin-Salicylamide-Caffeine 325-95-16 MG TABS Take 1 packet by mouth daily as needed (for pain/headache).   Yes Historical Provider, MD  ibuprofen (ADVIL,MOTRIN) 200 MG tablet Take 200 mg by mouth every 6 (six) hours as needed for headache, mild pain or moderate pain.   Yes Historical Provider, MD  lisinopril (PRINIVIL,ZESTRIL) 2.5  MG tablet Take 2.5 mg by mouth daily.   Yes Historical Provider, MD  omeprazole (PRILOSEC) 20 MG capsule Take 20 mg by mouth daily.   Yes Historical Provider, MD  HYDROcodone-acetaminophen (NORCO/VICODIN) 5-325 MG per tablet Take 1 tablet by mouth every 6 (six) hours as needed. Patient not taking: Reported on 12/06/2013 01/17/13   Lazaro Arms, MD  ketorolac (TORADOL) 10 MG tablet Take 1 tablet (10 mg total) by mouth every 8 (eight) hours as needed. Patient not taking: Reported on 12/06/2013 02/22/13   Lazaro Arms, MD  ondansetron (ZOFRAN) 8 MG tablet Take 1 tablet (8 mg total) by mouth every 8  (eight) hours as needed for nausea. Patient not taking: Reported on 12/06/2013 11/17/12   Lazaro Arms, MD   BP 131/94 mmHg  Pulse 82  Temp(Src) 98.2 F (36.8 C) (Oral)  Resp 18  Ht  (1.6 m)  Wt 143 lb 6.4 oz (65.046 kg)  BMI 25.41 kg/m2  SpO2 100%  LMP 02/05/2014 Physical Exam  Constitutional: She is oriented to person, place, and time. She appears well-developed and well-nourished. No distress.  HENT:  Head: Normocephalic and atraumatic.  Right Ear: Hearing normal.  Left Ear: Hearing normal.  Nose: Nose normal.  Mouth/Throat: Oropharynx is clear and moist and mucous membranes are normal.  Eyes: Conjunctivae and EOM are normal. Pupils are equal, round, and reactive to light.  Neck: Normal range of motion. Neck supple.  Cardiovascular: Regular rhythm, S1 normal and S2 normal.  Exam reveals no gallop and no friction rub.   No murmur heard. Pulmonary/Chest: Effort normal and breath sounds normal. No respiratory distress. She exhibits no tenderness.  Abdominal: Soft. Normal appearance and bowel sounds are normal. There is no hepatosplenomegaly. There is no tenderness. There is no rebound, no guarding, no tenderness at McBurney's point and negative Murphy's sign. No hernia.  Musculoskeletal: Normal range of motion.  Neurological: She is alert and oriented to person, place, and time. She has normal strength. No cranial nerve deficit or sensory deficit. Coordination normal. GCS eye subscore is 4. GCS verbal subscore is 5. GCS motor subscore is 6.  Strength, coordination, and sensation all intact.  Extraocular muscle movement: normal No visual field cut Pupils: equal and reactive both direct and consensual response is normal No nystagmus present    Sensory function is intact to light touch, pinprick Proprioception intact  Grip strength 5/5 symmetric in upper extremities Lower extremity strength 5/5 against gravity No pronator drift Normal finger to nose bilaterally Normal  heel to shin bilaterally  Gait: normal    Skin: Skin is warm, dry and intact. No rash noted. No cyanosis.  Psychiatric: She has a normal mood and affect. Her speech is normal and behavior is normal. Thought content normal.  Nursing note and vitals reviewed.   ED Course  Procedures (including critical care time)  DIAGNOSTIC STUDIES: Oxygen Saturation is 100% on room air, normal by my interpretation.    COORDINATION OF CARE: 3:20 PM - Discussed treatment plan with pt at bedside which includes tests for stroke, blood, and electrolytes and pt agreed to plan. She requests pain medication for her headache, to which I agreed.   Labs Review Labs Reviewed - No data to display  Imaging Review No results found.   EKG Interpretation None      MDM   Final diagnoses:  None   generalized weakness Dizziness  Patient presented to the ER for evaluation of generalized weakness and dizziness. Symptoms have  been ongoing for 4 days. Dizziness is not vertiginous. Patient reports that she has no energy, feels weak. This feels like when she has had anemia in the past. Patient reports a history of heavy vaginal bleeding resulting in anemia. She had endometrial ablation, but has started having heavy periods again. She is not currently bleeding. No chest pain or shortness of breath. Patient has had a headache associated with dizziness. Neurologic examination is unremarkable. CT scan was negative. All blood work was normal. Vital signs are stable. Patient reassured, symptomatic treatments, follow-up with primary doctor.   I personally performed the services described in this documentation, which was scribed in my presence. The recorded information has been reviewed and is accurate.       Gilda Crease, MD 02/09/14 [id number]

## 2014-03-21 ENCOUNTER — Other Ambulatory Visit (HOSPITAL_COMMUNITY): Payer: Self-pay | Admitting: *Deleted

## 2014-03-21 DIAGNOSIS — Z09 Encounter for follow-up examination after completed treatment for conditions other than malignant neoplasm: Secondary | ICD-10-CM

## 2014-03-21 DIAGNOSIS — R921 Mammographic calcification found on diagnostic imaging of breast: Secondary | ICD-10-CM

## 2014-04-18 ENCOUNTER — Ambulatory Visit (HOSPITAL_COMMUNITY)
Admission: RE | Admit: 2014-04-18 | Discharge: 2014-04-18 | Disposition: A | Discharge status: Home / Self Care | Payer: PRIVATE HEALTH INSURANCE | Source: Ambulatory Visit | Attending: *Deleted | Admitting: *Deleted

## 2014-04-18 DIAGNOSIS — R921 Mammographic calcification found on diagnostic imaging of breast: Secondary | ICD-10-CM

## 2014-04-18 DIAGNOSIS — R928 Other abnormal and inconclusive findings on diagnostic imaging of breast: Secondary | ICD-10-CM | POA: Insufficient documentation

## 2014-04-18 DIAGNOSIS — Z09 Encounter for follow-up examination after completed treatment for conditions other than malignant neoplasm: Secondary | ICD-10-CM

## 2014-05-12 ENCOUNTER — Emergency Department (HOSPITAL_COMMUNITY)
Admission: EM | Admit: 2014-05-12 | Discharge: 2014-05-12 | Disposition: A | Discharge status: Home / Self Care | Payer: PRIVATE HEALTH INSURANCE | Attending: Emergency Medicine | Admitting: Emergency Medicine

## 2014-05-12 ENCOUNTER — Encounter (HOSPITAL_COMMUNITY): Payer: Self-pay | Admitting: *Deleted

## 2014-05-12 DIAGNOSIS — J449 Chronic obstructive pulmonary disease, unspecified: Secondary | ICD-10-CM | POA: Insufficient documentation

## 2014-05-12 DIAGNOSIS — I10 Essential (primary) hypertension: Secondary | ICD-10-CM | POA: Insufficient documentation

## 2014-05-12 DIAGNOSIS — Z862 Personal history of diseases of the blood and blood-forming organs and certain disorders involving the immune mechanism: Secondary | ICD-10-CM | POA: Insufficient documentation

## 2014-05-12 DIAGNOSIS — L03818 Cellulitis of other sites: Secondary | ICD-10-CM | POA: Insufficient documentation

## 2014-05-12 DIAGNOSIS — Z79899 Other long term (current) drug therapy: Secondary | ICD-10-CM | POA: Insufficient documentation

## 2014-05-12 DIAGNOSIS — K219 Gastro-esophageal reflux disease without esophagitis: Secondary | ICD-10-CM | POA: Insufficient documentation

## 2014-05-12 DIAGNOSIS — Z720 Tobacco use: Secondary | ICD-10-CM | POA: Insufficient documentation

## 2014-05-12 MED ORDER — MUPIROCIN 2 % EX OINT: TOPICAL_OINTMENT | CUTANEOUS | Status: DC

## 2014-05-12 MED ORDER — SULFAMETHOXAZOLE-TRIMETHOPRIM 800-160 MG PO TABS
1.0000 | ORAL_TABLET | Freq: Once | ORAL | Status: AC
  Administered 2014-05-12: 1 via ORAL
  Filled 2014-05-12: qty 1

## 2014-05-12 MED ORDER — TRAMADOL HCL 50 MG PO TABS: 50.0000 mg | ORAL_TABLET | Freq: Four times a day (QID) | ORAL | Status: DC | PRN

## 2014-05-12 MED ORDER — SULFAMETHOXAZOLE-TRIMETHOPRIM 800-160 MG PO TABS: 1.0000 | ORAL_TABLET | Freq: Two times a day (BID) | ORAL | Status: AC

## 2014-05-12 NOTE — ED Notes (Signed)
Pt states she woke up this morning and noticed a large boil on left groin area, painful to touch per pt.

## 2014-05-12 NOTE — ED Provider Notes (Signed)
CSN: [id number]     Arrival date & time 05/12/14  [id number] History   First MD Initiated Contact with Patient 05/12/14 [phone]     Chief Complaint  Patient presents with  . Abscess    boil to lt groin     (Consider location/radiation/quality/duration/timing/severity/associated sxs/prior Treatment) HPI   [name] is a 49 y.o. female who presents to the Emergency Department complaining of abscess to her left groin.  She states that she noticed it this morning.  She reports pain to the touch of the area.  She is unsure if it may be a possible insect bite.  She denies previous boils, fever, chills, or abdominal pain. No has not tried any medications or therapies prior to arrival.    Past Medical History  Diagnosis Date  . COPD (chronic obstructive pulmonary disease)   . Anemia   . Pleurisy   . GERD (gastroesophageal reflux disease)   . Hypertension    Past Surgical History  Procedure Laterality Date  . Tubal ligation    . Cyst removed from right face    . Esophagogastroduodenoscopy  11/07/2010    Procedure: ESOPHAGOGASTRODUODENOSCOPY (EGD);  Surgeon: Corbin Adeobert M Rourk, MD;  Location: [facility];  Service: Endoscopy;  Laterality: N/A;  . Dilitation & currettage/hystroscopy with thermachoice ablation N/A 11/17/2012    Procedure: DILATATION & CURETTAGE/HYSTEROSCOPY WITH THERMACHOICE ABLATION;  Surgeon: Lazaro ArmsLuther H Eure, MD;  Location: [facility];  Service: Gynecology;  Laterality: N/A;  Total D5 in 21ml, total D5 out 21ml; temp 86-87 degrees Celcius; total time 9 minutes and 36 seconds;    Family History  Problem Relation Age of Onset  . Colon cancer Father 4340    deceased secondary to kidney failure  . Diabetes Father   . Colon cancer Brother 5042    recent diagnosis (2012). Finishing chemotherapy.  . Breast cancer Maternal Aunt   . Diabetes Mother   . Diabetes Brother   . Hypertension Brother   . Diabetes Maternal Grandmother    History  Substance Use Topics  . Smoking status:  Current Every Day Smoker -- 0.50 packs/day for 20 years    Types: Cigarettes  . Smokeless tobacco: Never Used  . Alcohol Use: No   OB History    Gravida Para Term Preterm AB TAB SAB Ectopic Multiple Living   2 2 2       2      Review of Systems  Constitutional: Negative for fever and chills.  Gastrointestinal: Negative for nausea and vomiting.  Musculoskeletal: Negative for joint swelling and arthralgias.  Skin: Positive for color change.       Abscess   Hematological: Negative for adenopathy.  All other systems reviewed and are negative.     Allergies  Review of patient's allergies indicates no known allergies.  Home Medications   Prior to Admission medications   Medication Sig Start Date End Date Taking? Authorizing Provider  acetaminophen (TYLENOL) 500 MG tablet Take 500 mg by mouth every 6 (six) hours as needed for pain.    Historical Provider, MD  Aspirin-Salicylamide-Caffeine [phone] MG TABS Take 1 packet by mouth daily as needed (for pain/headache).    Historical Provider, MD  HYDROcodone-acetaminophen (NORCO/VICODIN) 5-325 MG per tablet Take 1 tablet by mouth every 6 (six) hours as needed. Patient not taking: Reported on 12/06/2013 01/17/13   Lazaro ArmsLuther H Eure, MD  ibuprofen (ADVIL,MOTRIN) 200 MG tablet Take 200 mg by mouth every 6 (six) hours as needed for headache, mild pain or  moderate pain.    Historical Provider, MD  ketorolac (TORADOL) 10 MG tablet Take 1 tablet (10 mg total) by mouth every 8 (eight) hours as needed. Patient not taking: Reported on 12/06/2013 02/22/13   Lazaro Arms, MD  lisinopril (PRINIVIL,ZESTRIL) 2.5 MG tablet Take 2.5 mg by mouth daily.    Historical Provider, MD  omeprazole (PRILOSEC) 20 MG capsule Take 20 mg by mouth daily.    Historical Provider, MD  ondansetron (ZOFRAN) 8 MG tablet Take 1 tablet (8 mg total) by mouth every 8 (eight) hours as needed for nausea. Patient not taking: Reported on 12/06/2013 11/17/12   Lazaro Arms, MD   BP  148/100 mmHg  Pulse 81  Temp(Src) 97.8 F (36.6 C) (Oral)  Resp 18  Ht  (1.6 m)  Wt 145 lb (65.772 kg)  BMI 25.69 kg/m2  SpO2 98%  LMP 03/13/2014 (Exact Date) Physical Exam  Constitutional: She is oriented to person, place, and time. She appears well-developed and well-nourished. No distress.  HENT:  Head: Normocephalic and atraumatic.  Cardiovascular: Normal rate, regular rhythm and normal heart sounds.   No murmur heard. Pulmonary/Chest: Effort normal and breath sounds normal. No respiratory distress.  Abdominal: Soft. She exhibits no distension. There is no tenderness.  Musculoskeletal: Normal range of motion.  Neurological: She is alert and oriented to person, place, and time. She exhibits normal muscle tone. Coordination normal.  Skin: Skin is warm and dry. There is erythema.  1 cm papule to the left groin with mild surrounding erythema.  No fluctuance, induration or drainage.    Nursing note and vitals reviewed.   ED Course  Procedures (including critical care time) Labs Review Labs Reviewed - No data to display  Imaging Review No results found.   EKG Interpretation None      MDM   Final diagnoses:  Cellulitis of other specified site    Pt is well appearing, non-toxic.  Possible insect bite or early developing abscess to the left groin.  I&D not indicated at this time although I have explained that if the sx's worsen she may need to return for possible I&D.  She agrees to antibiotics, warm water soaks and to return if needed.     Pauline Aus, PA-C 05/12/14 0827  Bethann Berkshire, MD 05/12/14 [phone]

## 2014-05-12 NOTE — Discharge Instructions (Signed)
Cellulitis °Cellulitis is an infection of the skin and the tissue under the skin. The infected area is usually red and tender. This happens most often in the arms and lower legs. °HOME CARE  °· Take your antibiotic medicine as told. Finish the medicine even if you start to feel better. °· Keep the infected arm or leg raised (elevated). °· Put a warm cloth on the area up to 4 times per day. °· Only take medicines as told by your doctor. °· Keep all doctor visits as told. °GET HELP IF: °· You see red streaks on the skin coming from the infected area. °· Your red area gets bigger or turns a dark color. °· Your bone or joint under the infected area is painful after the skin heals. °· Your infection comes back in the same area or different area. °· You have a puffy (swollen) bump in the infected area. °· You have new symptoms. °· You have a fever. °GET HELP RIGHT AWAY IF:  °· You feel very sleepy. °· You throw up (vomit) or have watery poop (diarrhea). °· You feel sick and have muscle aches and pains. °MAKE SURE YOU:  °· Understand these instructions. °· Will watch your condition. °· Will get help right away if you are not doing well or get worse. °Document Released: 07/02/2007 Document Revised: 05/30/2013 Document Reviewed: 03/31/2011 °ExitCare® Patient Information ©2015 [employer]. This information is not intended to replace advice given to you by your health care provider. Make sure you discuss any questions you have with your health care provider. ° °Abscess °An abscess (boil or furuncle) is an infected area on or under the skin. This area is filled with yellowish-white fluid (pus) and other material (debris). °HOME CARE  °· Only take medicines as told by your doctor. °· If you were given antibiotic medicine, take it as directed. Finish the medicine even if you start to feel better. °· If gauze is used, follow your doctor's directions for changing the gauze. °· To avoid spreading the infection: °¨ Keep your  abscess covered with a bandage. °¨ Wash your hands well. °¨ Do not share personal care items, towels, or whirlpools with others. °¨ Avoid skin contact with others. °· Keep your skin and clothes clean around the abscess. °· Keep all doctor visits as told. °GET HELP RIGHT AWAY IF:  °· You have more pain, puffiness (swelling), or redness in the wound site. °· You have more fluid or blood coming from the wound site. °· You have muscle aches, chills, or you feel sick. °· You have a fever. °MAKE SURE YOU:  °· Understand these instructions. °· Will watch your condition. °· Will get help right away if you are not doing well or get worse. °Document Released: 07/02/2007 Document Revised: 07/15/2011 Document Reviewed: 03/28/2011 °ExitCare® Patient Information ©2015 [employer]. This information is not intended to replace advice given to you by your health care provider. Make sure you discuss any questions you have with your health care provider. ° °

## 2014-06-14 ENCOUNTER — Emergency Department (HOSPITAL_COMMUNITY)
Admission: EM | Admit: 2014-06-14 | Discharge: 2014-06-14 | Disposition: A | Discharge status: Home / Self Care | Payer: PRIVATE HEALTH INSURANCE | Attending: Emergency Medicine | Admitting: Emergency Medicine

## 2014-06-14 ENCOUNTER — Encounter (HOSPITAL_COMMUNITY): Payer: Self-pay

## 2014-06-14 DIAGNOSIS — Z79899 Other long term (current) drug therapy: Secondary | ICD-10-CM | POA: Insufficient documentation

## 2014-06-14 DIAGNOSIS — I10 Essential (primary) hypertension: Secondary | ICD-10-CM | POA: Insufficient documentation

## 2014-06-14 DIAGNOSIS — M5416 Radiculopathy, lumbar region: Secondary | ICD-10-CM

## 2014-06-14 DIAGNOSIS — Z720 Tobacco use: Secondary | ICD-10-CM | POA: Insufficient documentation

## 2014-06-14 DIAGNOSIS — Z862 Personal history of diseases of the blood and blood-forming organs and certain disorders involving the immune mechanism: Secondary | ICD-10-CM | POA: Insufficient documentation

## 2014-06-14 DIAGNOSIS — K219 Gastro-esophageal reflux disease without esophagitis: Secondary | ICD-10-CM | POA: Insufficient documentation

## 2014-06-14 DIAGNOSIS — J449 Chronic obstructive pulmonary disease, unspecified: Secondary | ICD-10-CM | POA: Insufficient documentation

## 2014-06-14 MED ORDER — DICLOFENAC SODIUM 75 MG PO TBEC: 75.0000 mg | DELAYED_RELEASE_TABLET | Freq: Two times a day (BID) | ORAL | Status: DC

## 2014-06-14 MED ORDER — HYDROCODONE-ACETAMINOPHEN 5-325 MG PO TABS: ORAL_TABLET | ORAL | Status: DC

## 2014-06-14 NOTE — ED Notes (Signed)
Pt reports rearranged her bedroom furniture yesterday and woke up with pain in lower back today.  Reports pain is in left lower back radiating down to left knee.

## 2014-06-14 NOTE — ED Provider Notes (Signed)
CSN: [id number]     Arrival date & time 06/14/14  0741 History   First MD Initiated Contact with Patient 06/14/14 [phone]     Chief Complaint  Patient presents with  . Back Pain     (Consider location/radiation/quality/duration/timing/severity/associated sxs/prior Treatment) HPI   [name] is a 49 y.o. female who presents to the Emergency Department complaining of left sided low back pain.  She states she woke up this morning with sharp pain to her left low back that radiate into her hip, buttock and into her thigh.  Pain is worse with movement.  She was moving furniture yesterday and thinks that is what caused her pain.  She has not tried any medications or other therapies.  She denies abd pain, urine or bowel changes, numbness or weakness of the lower extremities.      Past Medical History  Diagnosis Date  . COPD (chronic obstructive pulmonary disease)   . Anemia   . Pleurisy   . GERD (gastroesophageal reflux disease)   . Hypertension    Past Surgical History  Procedure Laterality Date  . Tubal ligation    . Cyst removed from right face    . Esophagogastroduodenoscopy  11/07/2010    Procedure: ESOPHAGOGASTRODUODENOSCOPY (EGD);  Surgeon: Corbin Adeobert M Rourk, MD;  Location: [facility];  Service: Endoscopy;  Laterality: N/A;  . Dilitation & currettage/hystroscopy with thermachoice ablation N/A 11/17/2012    Procedure: DILATATION & CURETTAGE/HYSTEROSCOPY WITH THERMACHOICE ABLATION;  Surgeon: Lazaro ArmsLuther H Eure, MD;  Location: [facility];  Service: Gynecology;  Laterality: N/A;  Total D5 in 21ml, total D5 out 21ml; temp 86-87 degrees Celcius; total time 9 minutes and 36 seconds;    Family History  Problem Relation Age of Onset  . Colon cancer Father 8340    deceased secondary to kidney failure  . Diabetes Father   . Colon cancer Brother 5142    recent diagnosis (2012). Finishing chemotherapy.  . Breast cancer Maternal Aunt   . Diabetes Mother   . Diabetes Brother   . Hypertension  Brother   . Diabetes Maternal Grandmother    History  Substance Use Topics  . Smoking status: Current Every Day Smoker -- 0.50 packs/day for 20 years    Types: Cigarettes  . Smokeless tobacco: Never Used  . Alcohol Use: No   OB History    Gravida Para Term Preterm AB TAB SAB Ectopic Multiple Living   2 2 2       2      Review of Systems  Constitutional: Negative for fever.  Respiratory: Negative for shortness of breath.   Gastrointestinal: Negative for vomiting, abdominal pain and constipation.  Genitourinary: Negative for dysuria, hematuria, flank pain, decreased urine volume and difficulty urinating.  Musculoskeletal: Positive for back pain. Negative for joint swelling.  Skin: Negative for rash.  Neurological: Negative for weakness and numbness.  All other systems reviewed and are negative.     Allergies  Tramadol and Tylenol  Home Medications   Prior to Admission medications   Medication Sig Start Date End Date Taking? Authorizing Provider  acetaminophen (TYLENOL) 500 MG tablet Take 500 mg by mouth every 6 (six) hours as needed for pain.    Historical Provider, MD  Aspirin-Salicylamide-Caffeine [phone] MG TABS Take 1 packet by mouth daily as needed (for pain/headache).    Historical Provider, MD  HYDROcodone-acetaminophen (NORCO/VICODIN) 5-325 MG per tablet Take 1 tablet by mouth every 6 (six) hours as needed. Patient not taking: Reported on 12/06/2013 01/17/13  Lazaro ArmsLuther H Eure, MD  ibuprofen (ADVIL,MOTRIN) 200 MG tablet Take 200 mg by mouth every 6 (six) hours as needed for headache, mild pain or moderate pain.    Historical Provider, MD  ketorolac (TORADOL) 10 MG tablet Take 1 tablet (10 mg total) by mouth every 8 (eight) hours as needed. Patient not taking: Reported on 12/06/2013 02/22/13   Lazaro ArmsLuther H Eure, MD  lisinopril (PRINIVIL,ZESTRIL) 2.5 MG tablet Take 2.5 mg by mouth daily.    Historical Provider, MD  mupirocin ointment (BACTROBAN) 2 % Apply to the affected area  TID x 10 days 05/12/14   Ilithyia Titzer, PA-C  omeprazole (PRILOSEC) 20 MG capsule Take 20 mg by mouth daily.    Historical Provider, MD  ondansetron (ZOFRAN) 8 MG tablet Take 1 tablet (8 mg total) by mouth every 8 (eight) hours as needed for nausea. Patient not taking: Reported on 12/06/2013 11/17/12   Lazaro ArmsLuther H Eure, MD  traMADol (ULTRAM) 50 MG tablet Take 1 tablet (50 mg total) by mouth every 6 (six) hours as needed. 05/12/14   Saraih Lorton, PA-C   BP 174/111 mmHg  Pulse 82  Temp(Src) 97.7 F (36.5 C) (Oral)  Resp 20  Ht 5\' 3"  (1.6 m)  Wt 140 lb (63.504 kg)  BMI 24.81 kg/m2  SpO2 96% Physical Exam  Constitutional: She is oriented to person, place, and time. She appears well-developed and well-nourished. No distress.  HENT:  Head: Normocephalic and atraumatic.  Neck: Normal range of motion. Neck supple.  Cardiovascular: Normal rate, regular rhythm, normal heart sounds and intact distal pulses.   No murmur heard. Pulmonary/Chest: Effort normal and breath sounds normal. No respiratory distress.  Abdominal: Soft. She exhibits no distension. There is no tenderness.  Musculoskeletal: She exhibits tenderness. She exhibits no edema.       Lumbar back: She exhibits tenderness and pain. She exhibits normal range of motion, no swelling, no deformity, no laceration and normal pulse.  ttp of the left lumbar paraspinal muscles and SI joint.  No spinal tenderness.  DP pulses are brisk and symmetrical.  Distal sensation intact.  Hip Flexors/Extensors are intact.  Pt has 5/5 strength against resistance of bilateral lower extremities.     Neurological: She is alert and oriented to person, place, and time. She has normal strength. No sensory deficit. She exhibits normal muscle tone. Coordination and gait normal.  Reflex Scores:      Patellar reflexes are 2+ on the right side and 2+ on the left side.      Achilles reflexes are 2+ on the right side and 2+ on the left side. Skin: Skin is warm and dry. No  rash noted.  Nursing note and vitals reviewed.   ED Course  Procedures (including critical care time) Labs Review Labs Reviewed - No data to display  Imaging Review No results found.   EKG Interpretation None      MDM   Final diagnoses:  Lumbar radicular pain    Pt is well appearing, hypertensive this morning but states she has not taken her BP medications this morning.  Denies any sx's other than back pain.  Agrees to take her meds when she gets home.    Ambulates with steady gait. No focal neuro deficits, likely sciatica.  No concerning sx's for emergent neurological process.  Agrees to symptomatic tx and close PMD f/u if needed.      Pauline Ausammy Kobey Sides, PA-C 06/14/14 0830  Benjiman CoreNathan Pickering, MD 06/14/14 1410

## 2014-06-28 ENCOUNTER — Encounter (HOSPITAL_COMMUNITY): Payer: Self-pay | Admitting: Cardiology

## 2014-06-28 ENCOUNTER — Emergency Department (HOSPITAL_COMMUNITY): Payer: PRIVATE HEALTH INSURANCE

## 2014-06-28 ENCOUNTER — Emergency Department (HOSPITAL_COMMUNITY)
Admission: EM | Admit: 2014-06-28 | Discharge: 2014-06-28 | Disposition: A | Discharge status: Home / Self Care | Payer: PRIVATE HEALTH INSURANCE | Attending: Emergency Medicine | Admitting: Emergency Medicine

## 2014-06-28 DIAGNOSIS — S93402A Sprain of unspecified ligament of left ankle, initial encounter: Secondary | ICD-10-CM | POA: Insufficient documentation

## 2014-06-28 DIAGNOSIS — Y998 Other external cause status: Secondary | ICD-10-CM | POA: Insufficient documentation

## 2014-06-28 DIAGNOSIS — Y9389 Activity, other specified: Secondary | ICD-10-CM | POA: Insufficient documentation

## 2014-06-28 DIAGNOSIS — Z79899 Other long term (current) drug therapy: Secondary | ICD-10-CM | POA: Insufficient documentation

## 2014-06-28 DIAGNOSIS — X58XXXA Exposure to other specified factors, initial encounter: Secondary | ICD-10-CM | POA: Insufficient documentation

## 2014-06-28 DIAGNOSIS — Z862 Personal history of diseases of the blood and blood-forming organs and certain disorders involving the immune mechanism: Secondary | ICD-10-CM | POA: Insufficient documentation

## 2014-06-28 DIAGNOSIS — I10 Essential (primary) hypertension: Secondary | ICD-10-CM | POA: Insufficient documentation

## 2014-06-28 DIAGNOSIS — Y9289 Other specified places as the place of occurrence of the external cause: Secondary | ICD-10-CM | POA: Insufficient documentation

## 2014-06-28 DIAGNOSIS — Z720 Tobacco use: Secondary | ICD-10-CM | POA: Insufficient documentation

## 2014-06-28 DIAGNOSIS — K219 Gastro-esophageal reflux disease without esophagitis: Secondary | ICD-10-CM | POA: Insufficient documentation

## 2014-06-28 DIAGNOSIS — J449 Chronic obstructive pulmonary disease, unspecified: Secondary | ICD-10-CM | POA: Insufficient documentation

## 2014-06-28 DIAGNOSIS — Z791 Long term (current) use of non-steroidal anti-inflammatories (NSAID): Secondary | ICD-10-CM | POA: Insufficient documentation

## 2014-06-28 MED ORDER — HYDROCODONE-ACETAMINOPHEN 5-325 MG PO TABS: ORAL_TABLET | ORAL | Status: DC

## 2014-06-28 MED ORDER — IBUPROFEN 600 MG PO TABS: 600.0000 mg | ORAL_TABLET | Freq: Four times a day (QID) | ORAL | Status: DC | PRN

## 2014-06-28 NOTE — ED Notes (Signed)
Pt made aware to return if symptoms worsen or if any life threatening symptoms occur.   

## 2014-06-28 NOTE — Discharge Instructions (Signed)
Ankle Sprain  An ankle sprain is an injury to the strong, fibrous tissues (ligaments) that hold your ankle bones together.   HOME CARE   · Put ice on your ankle for 1-2 days or as told by your doctor.  ¨ Put ice in a plastic bag.  ¨ Place a towel between your skin and the bag.  ¨ Leave the ice on for 15-20 minutes at a time, every 2 hours while you are awake.  · Only take medicine as told by your doctor.  · Raise (elevate) your injured ankle above the level of your heart as much as possible for 2-3 days.  · Use crutches if your doctor tells you to. Slowly put your own weight on the affected ankle. Use the crutches until you can walk without pain.  · If you have a plaster splint:  ¨ Do not rest it on anything harder than a pillow for 24 hours.  ¨ Do not put weight on it.  ¨ Do not get it wet.  ¨ Take it off to shower or bathe.  · If given, use an elastic wrap or support stocking for support. Take the wrap off if your toes lose feeling (numb), tingle, or turn cold or blue.  · If you have an air splint:  ¨ Add or let out air to make it comfortable.  ¨ Take it off at night and to shower and bathe.  ¨ Wiggle your toes and move your ankle up and down often while you are wearing it.  GET HELP IF:  · You have rapidly increasing bruising or puffiness (swelling).  · Your toes feel very cold.  · You lose feeling in your foot.  · Your medicine does not help your pain.  GET HELP RIGHT AWAY IF:   · Your toes lose feeling (numb) or turn blue.  · You have severe pain that is increasing.  MAKE SURE YOU:   · Understand these instructions.  · Will watch your condition.  · Will get help right away if you are not doing well or get worse.  Document Released: 07/02/2007 Document Revised: 05/30/2013 Document Reviewed: 07/28/2011  Exit[employer]® Patient Information ©2015 [employer]. This information is not intended to replace advice given to you by your health care provider. Make sure you discuss any questions you have with your health care  provider.

## 2014-06-28 NOTE — ED Notes (Signed)
Twisted left ankle yesterday getting out of a truck.  C/o left ankle and left foot pain.

## 2014-07-01 NOTE — ED Provider Notes (Signed)
CSN: [id number]     Arrival date & time 06/28/14  [id number] History   First MD Initiated Contact with Patient 06/28/14 1002     Chief Complaint  Patient presents with  . Ankle Pain     (Consider location/radiation/quality/duration/timing/severity/associated sxs/prior Treatment) HPI   [name] is a 49 y.o. female who presents to the Emergency Department complaining of left ankle and foot pain.  She states that she twisted her ankle while getting out of a pick up truck one day prior to ED arrival.  Pain to the ankle is worse with weight bearing.  She describes the pain as throbbing and radiating into her foot.  She has not tried any medications prior to arrival.  She denies fever, discoloration, calf pain or swelling.    Past Medical History  Diagnosis Date  . COPD (chronic obstructive pulmonary disease)   . Anemia   . Pleurisy   . GERD (gastroesophageal reflux disease)   . Hypertension    Past Surgical History  Procedure Laterality Date  . Tubal ligation    . Cyst removed from right face    . Esophagogastroduodenoscopy  11/07/2010    Procedure: ESOPHAGOGASTRODUODENOSCOPY (EGD);  Surgeon: Corbin Adeobert M Rourk, MD;  Location: [facility];  Service: Endoscopy;  Laterality: N/A;  . Dilitation & currettage/hystroscopy with thermachoice ablation N/A 11/17/2012    Procedure: DILATATION & CURETTAGE/HYSTEROSCOPY WITH THERMACHOICE ABLATION;  Surgeon: Lazaro ArmsLuther H Eure, MD;  Location: [facility];  Service: Gynecology;  Laterality: N/A;  Total D5 in 21ml, total D5 out 21ml; temp 86-87 degrees Celcius; total time 9 minutes and 36 seconds;    Family History  Problem Relation Age of Onset  . Colon cancer Father 540    deceased secondary to kidney failure  . Diabetes Father   . Colon cancer Brother [id number]    recent diagnosis (2012). Finishing chemotherapy.  . Breast cancer Maternal Aunt   . Diabetes Mother   . Diabetes Brother   . Hypertension Brother   . Diabetes Maternal Grandmother    History   Substance Use Topics  . Smoking status: Current Every Day Smoker -- 0.50 packs/day for 20 years    Types: Cigarettes  . Smokeless tobacco: Never Used  . Alcohol Use: No   OB History    Gravida Para Term Preterm AB TAB SAB Ectopic Multiple Living   2 2 2       2      Review of Systems  Constitutional: Negative for fever and chills.  Genitourinary: Negative for dysuria and difficulty urinating.  Musculoskeletal: Positive for joint swelling and arthralgias.  Skin: Negative for color change and wound.  All other systems reviewed and are negative.     Allergies  Tramadol and Tylenol  Home Medications   Prior to Admission medications   Medication Sig Start Date End Date Taking? Authorizing Provider  acetaminophen (TYLENOL) 500 MG tablet Take 500 mg by mouth every 6 (six) hours as needed for pain.    Historical Provider, MD  Aspirin-Salicylamide-Caffeine [id number] MG TABS Take 1 packet by mouth daily as needed (for pain/headache).    Historical Provider, MD  diclofenac (VOLTAREN) 75 MG EC tablet Take 1 tablet (75 mg total) by mouth 2 (two) times daily. Take with food 06/14/14   Darlisa Spruiell, PA-C  HYDROcodone-acetaminophen (NORCO/VICODIN) 5-325 MG per tablet Take one tab po q 4-6 hrs prn pain 06/28/14   Duard Spiewak, PA-C  ibuprofen (ADVIL,MOTRIN) 600 MG tablet Take 1 tablet (600 mg total) by  mouth every 6 (six) hours as needed. 06/28/14   Arnesia Vincelette, PA-C  lisinopril (PRINIVIL,ZESTRIL) 2.5 MG tablet Take 2.5 mg by mouth daily.    Historical Provider, MD  mupirocin ointment (BACTROBAN) 2 % Apply to the affected area TID x 10 days 05/12/14   Chyrl Elwell, PA-C  omeprazole (PRILOSEC) 20 MG capsule Take 20 mg by mouth daily.    Historical Provider, MD  ondansetron (ZOFRAN) 8 MG tablet Take 1 tablet (8 mg total) by mouth every 8 (eight) hours as needed for nausea. Patient not taking: Reported on 12/06/2013 11/17/12   Lazaro Arms, MD  traMADol (ULTRAM) 50 MG tablet Take 1 tablet (50  mg total) by mouth every 6 (six) hours as needed. 05/12/14   Zalea Pete, PA-C   BP 145/92 mmHg  Pulse 74  Temp(Src) 99 F (37.2 C) (Oral)  Resp 18  Ht  (1.6 m)  Wt 145 lb (65.772 kg)  BMI 25.69 kg/m2  SpO2 99% Physical Exam  Constitutional: She is oriented to person, place, and time. She appears well-developed and well-nourished. No distress.  HENT:  Head: Normocephalic and atraumatic.  Cardiovascular: Normal rate, regular rhythm, normal heart sounds and intact distal pulses.   Pulmonary/Chest: Effort normal and breath sounds normal. No respiratory distress.  Musculoskeletal: She exhibits tenderness.  ttp of the lateral left ankle.  Mild STS present.  DP pulse is brisk,distal sensation intact.  No erythema, abrasion, bruising or bony deformity.  No proximal tenderness.  Neurological: She is alert and oriented to person, place, and time. She exhibits normal muscle tone. Coordination normal.  Skin: Skin is warm and dry.  Nursing note and vitals reviewed.   ED Course  Procedures (including critical care time) Labs Review Labs Reviewed - No data to display  Imaging Review No results found.   EKG Interpretation None      MDM   Final diagnoses:  Left ankle sprain, initial encounter    XR neg for fx.  Likely sprain.  ASO applied, pain improved, remains NV intact.  Pt agrees to symptomatic tx and ortho f/u in one week if the sx''s are not improving.    Pauline Aus, PA-C 07/01/14 0020  Rolland Porter, MD 07/12/14 [phone]

## 2014-08-09 ENCOUNTER — Emergency Department (HOSPITAL_COMMUNITY): Payer: PRIVATE HEALTH INSURANCE

## 2014-08-09 ENCOUNTER — Encounter (HOSPITAL_COMMUNITY): Payer: Self-pay | Admitting: Emergency Medicine

## 2014-08-09 ENCOUNTER — Emergency Department (HOSPITAL_COMMUNITY)
Admission: EM | Admit: 2014-08-09 | Discharge: 2014-08-09 | Disposition: A | Discharge status: Home / Self Care | Payer: PRIVATE HEALTH INSURANCE | Attending: Emergency Medicine | Admitting: Emergency Medicine

## 2014-08-09 DIAGNOSIS — M79671 Pain in right foot: Secondary | ICD-10-CM

## 2014-08-09 DIAGNOSIS — J449 Chronic obstructive pulmonary disease, unspecified: Secondary | ICD-10-CM | POA: Insufficient documentation

## 2014-08-09 DIAGNOSIS — I10 Essential (primary) hypertension: Secondary | ICD-10-CM | POA: Insufficient documentation

## 2014-08-09 DIAGNOSIS — M7731 Calcaneal spur, right foot: Secondary | ICD-10-CM | POA: Insufficient documentation

## 2014-08-09 DIAGNOSIS — Z720 Tobacco use: Secondary | ICD-10-CM | POA: Insufficient documentation

## 2014-08-09 DIAGNOSIS — Z79899 Other long term (current) drug therapy: Secondary | ICD-10-CM | POA: Insufficient documentation

## 2014-08-09 DIAGNOSIS — K219 Gastro-esophageal reflux disease without esophagitis: Secondary | ICD-10-CM | POA: Insufficient documentation

## 2014-08-09 DIAGNOSIS — Z862 Personal history of diseases of the blood and blood-forming organs and certain disorders involving the immune mechanism: Secondary | ICD-10-CM | POA: Insufficient documentation

## 2014-08-09 MED ORDER — MELOXICAM 7.5 MG PO TABS: 7.5000 mg | ORAL_TABLET | Freq: Every day | ORAL | Status: DC

## 2014-08-09 MED ORDER — KETOROLAC TROMETHAMINE 60 MG/2ML IM SOLN
60.0000 mg | Freq: Once | INTRAMUSCULAR | Status: AC
  Administered 2014-08-09: 60 mg via INTRAMUSCULAR
  Filled 2014-08-09: qty 2

## 2014-08-09 NOTE — Discharge Instructions (Signed)
Heel Spur A heel spur is a hook of bone that can form on the calcaneus (the heel bone and the largest bone of the foot). Heel spurs are often associated with plantar fasciitis and usually come in people who have had the problem for an extended period of time. The cause of the relationship is unknown. The pain associated with them is thought to be caused by an inflammation (soreness and redness) of the plantar fascia rather than the spur itself. The plantar fascia is a thick fibrous like tissue that runs from the calcaneus (heel bone) to the ball of the foot. This strong, tight tissue helps maintain the arch of your foot. It helps distribute the weight across your foot as you walk or run. Stresses placed on the plantar fascia can be tremendous. When it is inflamed normal activities become painful. Pain is worse in the morning after sleeping. After sleeping the plantar fascia is tight. The first movements stretch the fascia and this causes pain. As the tendon loosens, the pain usually gets better. It often returns with too much standing or walking.  About 70% of patients with plantar fasciitis have a heel spur. About half of people without foot pain also have heel spurs. DIAGNOSIS  The diagnosis of a heel spur is made by X-ray. The X-ray shows a hook of bone protruding from the bottom of the calcaneus at the point where the plantar fascia is attached to the heel bone.  TREATMENT  It is necessary to find out what is causing the stretching of the plantar fascia. If the cause is over-pronation (flat feet), orthotics and proper foot ware may help.  Stretching exercises, losing weight, wearing shoes that have a cushioned heel that absorbs shock, and elevating the heel with the use of a heel cradle, heel cup, or orthotics may all help. Heel cradles and heel cups provide extra comfort and cushion to the heel, and reduce the amount of shock to the sore area. AVOIDING THE PAIN OF PLANTAR FASCIITIS AND HEEL  SPURS  Consult a sports medicine professional before beginning a new exercise program.  Walking programs offer a good workout. There is a lower chance of overuse injuries common to the runners. There is less impact and less jarring of the joints.  Begin all new exercise programs slowly. If problems or pains develop, decrease the amount of time or distance until you are at a comfortable level.  Wear good shoes and replace them regularly.  Stretch your foot and the heel cords at the back of the ankle (Achilles tendons) both before and after exercise.  Run or exercise on even surfaces that are not hard. For example, asphalt is better than pavement.  Do not run barefoot on hard surfaces.  If using a treadmill, vary the incline.  Do not continue to workout if you have foot or joint problems. Seek professional help if they do not improve. HOME CARE INSTRUCTIONS   Avoid activities that cause you pain until you recover.  Use ice or cold packs to the problem or painful areas after working out.  Only take over-the-counter or prescription medicines for pain, discomfort, or fever as directed by your caregiver.  Soft shoe inserts or athletic shoes with air or gel sole cushions may be helpful.  If problems continue or become more severe, consult a sports medicine caregiver. Cortisone is a potent anti-inflammatory medication that may be injected into the painful area. You can discuss this treatment with your caregiver. MAKE SURE YOU:     Understand these instructions.  Will watch your condition.  Will get help right away if you are not doing well or get worse. Document Released: 02/19/2005 Document Revised: 04/07/2011 Document Reviewed: 03/16/2013 [employer] Patient Information 2015 [employer]. This information is not intended to replace advice given to you by your health care provider. Make sure you discuss any questions you have with your health care provider.  

## 2014-08-09 NOTE — ED Notes (Signed)
Pt reports right heel pain since this am. Pt denies any known injury. No deformity or swelling noted.

## 2014-08-11 NOTE — ED Provider Notes (Signed)
CSN: [id number]     Arrival date & time 08/09/14  1414 History   First MD Initiated Contact with Patient 08/09/14 1500     Chief Complaint  Patient presents with  . Foot Pain     (Consider location/radiation/quality/duration/timing/severity/associated sxs/prior Treatment) The history is provided by the patient.   [name] is a 49 y.o. female with a history of copd, anemia, gerd and htn presenting with right heel pain since waking this am.  She denies any trauma or prior history of problems with her foot or heel.  She simply woke and when she placed her foot on the floor, she had exquisite pain and has been unable to weight bear.  Daughter at bedside speculates she may have inadvertently injured it when jumping out of a tall truck cab yesterday, but pt denies.  She denies overuse, states she is disabled and "does nothing".       Past Medical History  Diagnosis Date  . COPD (chronic obstructive pulmonary disease)   . Anemia   . Pleurisy   . GERD (gastroesophageal reflux disease)   . Hypertension    Past Surgical History  Procedure Laterality Date  . Tubal ligation    . Cyst removed from right face    . Esophagogastroduodenoscopy  11/07/2010    Procedure: ESOPHAGOGASTRODUODENOSCOPY (EGD);  Surgeon: Corbin Adeobert M Rourk, MD;  Location: [facility];  Service: Endoscopy;  Laterality: N/A;  . Dilitation & currettage/hystroscopy with thermachoice ablation N/A 11/17/2012    Procedure: DILATATION & CURETTAGE/HYSTEROSCOPY WITH THERMACHOICE ABLATION;  Surgeon: Lazaro ArmsLuther H Eure, MD;  Location: [facility];  Service: Gynecology;  Laterality: N/A;  Total D5 in 21ml, total D5 out 21ml; temp 86-87 degrees Celcius; total time 9 minutes and 36 seconds;   . Ablation     Family History  Problem Relation Age of Onset  . Colon cancer Father 7140    deceased secondary to kidney failure  . Diabetes Father   . Colon cancer Brother [id number]    recent diagnosis (2012). Finishing chemotherapy.  . Breast cancer  Maternal Aunt   . Diabetes Mother   . Diabetes Brother   . Hypertension Brother   . Diabetes Maternal Grandmother    History  Substance Use Topics  . Smoking status: Current Every Day Smoker -- 0.50 packs/day for 20 years    Types: Cigarettes  . Smokeless tobacco: Never Used  . Alcohol Use: No   OB History    Gravida Para Term Preterm AB TAB SAB Ectopic Multiple Living   2 2 2       2      Review of Systems  Constitutional: Negative for fever.  Musculoskeletal: Positive for arthralgias. Negative for myalgias and joint swelling.  Neurological: Negative for weakness and numbness.      Allergies  Tramadol and Tylenol  Home Medications   Prior to Admission medications   Medication Sig Start Date End Date Taking? Authorizing Provider  lisinopril (PRINIVIL,ZESTRIL) 5 MG tablet Take 5 mg by mouth daily.   Yes Historical Provider, MD  omeprazole (PRILOSEC) 20 MG capsule Take 20 mg by mouth daily.   Yes Historical Provider, MD  ibuprofen (ADVIL,MOTRIN) 600 MG tablet Take 1 tablet (600 mg total) by mouth every 6 (six) hours as needed. Patient not taking: Reported on 08/09/2014 06/28/14   Tammy Triplett, PA-C  meloxicam (MOBIC) 7.5 MG tablet Take 1-2 tablets (7.5-15 mg total) by mouth daily. 08/09/14   Burgess AmorJulie Jamilya Sarrazin, PA-C   BP 147/100 mmHg  Pulse 78  Temp(Src) 98 F (36.7 C) (Oral)  Resp 16  Ht  (1.6 m)  Wt 145 lb (65.772 kg)  BMI 25.69 kg/m2  SpO2 100% Physical Exam  Constitutional: She appears well-developed and well-nourished.  HENT:  Head: Atraumatic.  Neck: Normal range of motion.  Cardiovascular:  Pulses equal bilaterally  Musculoskeletal: She exhibits tenderness.       Feet:  Point tender to palpation right plantar heel at fascia insertion. No radiation of pain.  Can flex toes without increased pain.  Skin intact without injury, no color changes. Dorsalis pedis pulse intact.  Neurological: She is alert. She has normal strength. She displays normal reflexes. No  sensory deficit.  Skin: Skin is warm and dry.  Psychiatric: She has a normal mood and affect.    ED Course  Procedures (including critical care time) Labs Review Labs Reviewed - No data to display  Imaging Review Dg Foot Complete Right  08/09/2014   CLINICAL DATA:  Right heel pain  EXAM: RIGHT FOOT COMPLETE - 3+ VIEW  COMPARISON:  None.  FINDINGS: Three views of the right foot submitted. No acute fracture or subluxation. Tiny plantar spur of calcaneus.  IMPRESSION: No acute fracture or subluxation.  Tiny plantar spur of calcaneus.   Electronically Signed   By: Natasha Mead M.D.   On: 08/09/2014 16:04     EKG Interpretation None      MDM   Final diagnoses:  Heel pain, right  Heel spur, right    Patients labs and/or radiological studies were reviewed and considered during the medical decision making and disposition process.  Results were also discussed with patient. Pt was encouraged to find a shoe insert specific for heel spurs.  Wear well cushioned shoes. meloxicam prescribed for pain and inflammation.  Referral to pcp to establish care as pt has presented here multiple time recently for non emergent complaints, discussed the importance of having a pcp.      Burgess Amor, PA-C 08/11/14 1254  Donnetta Hutching, MD 08/11/14 1350

## 2014-10-18 IMAGING — CT CT ABD-PELV W/ CM
2 of 5 series · 16 of 46 positions shown, 18 images · IV contrast (Omnipaque 300)
Comparison: 11/06/2010

CLINICAL DATA: Abdominal pain, weakness and fever.

CT ABDOMEN AND PELVIS WITH CONTRAST
TECHNIQUE: Multidetector CT imaging of the abdomen and pelvis was
performed following the standard protocol during bolus
administration of intravenous contrast.
Contrast: 50mL OMNIPAQUE IOHEXOL 300 MG/ML SOLN, 100mL OMNIPAQUE
IOHEXOL 300 MG/ML  SOLN

[Series 2: abd_pel_with 5.0 b40f · axial · 0.66mm/px · z∈[-420,-40]mm · 13 of 86 slices shown, 15 images]
[im 5/86  soft-tissue]
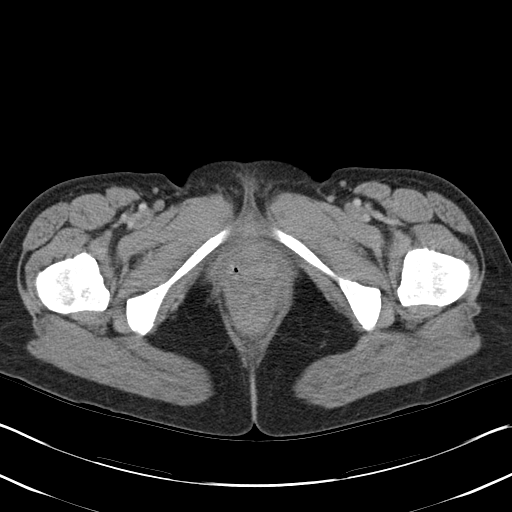
[im 5/86  bone]
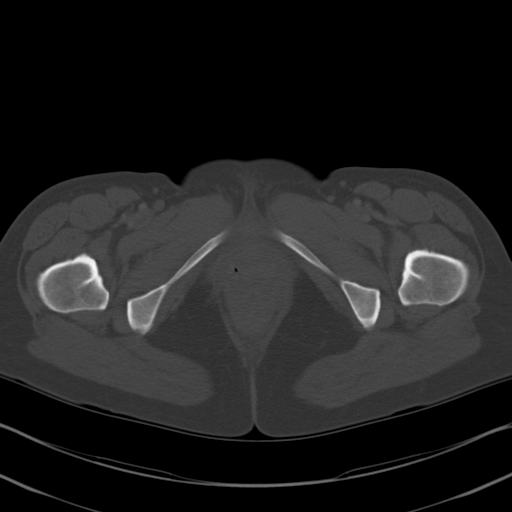
[im 10/86  soft-tissue]
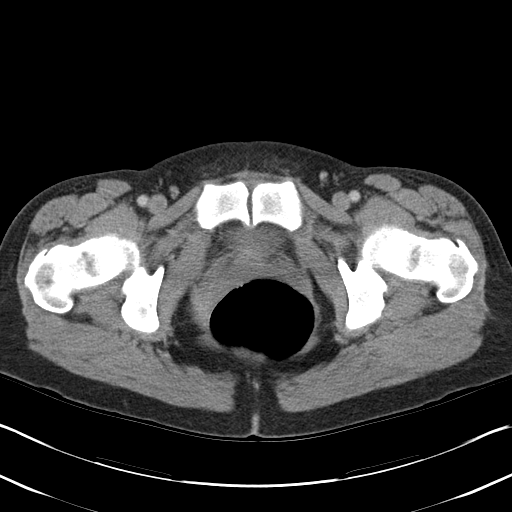
[im 19/86  soft-tissue]
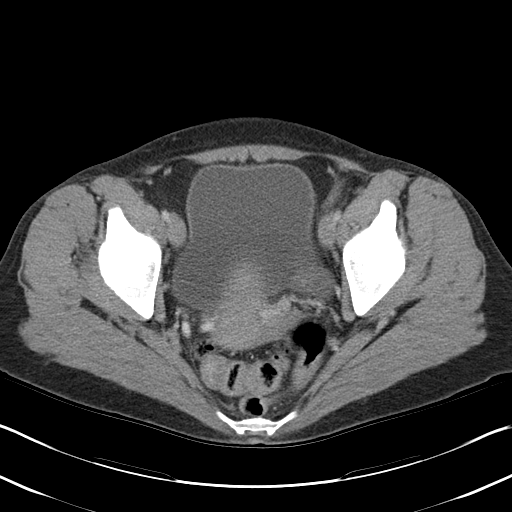
[im 24/86  soft-tissue]
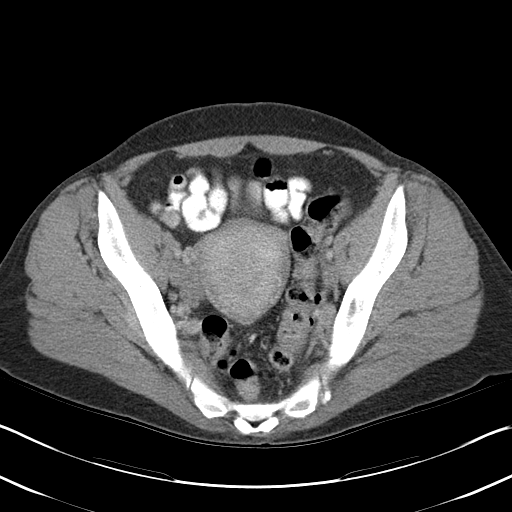
[im 29/86  soft-tissue]
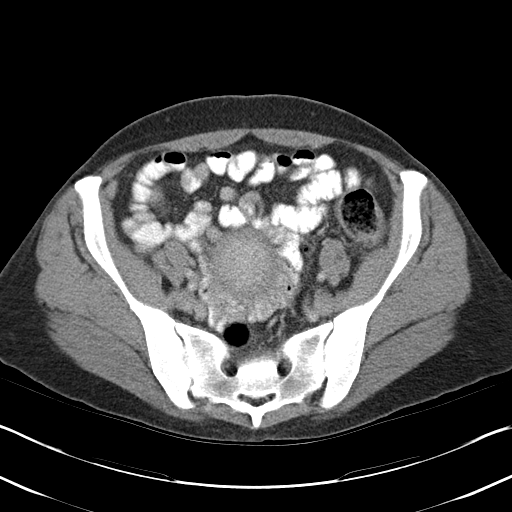
[im 38/86  soft-tissue]
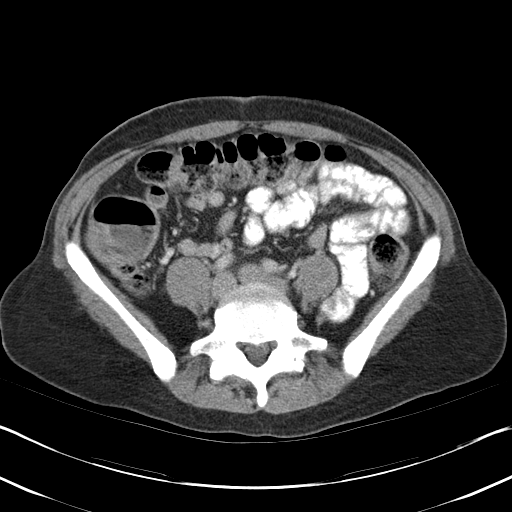
[im 43/86  soft-tissue]
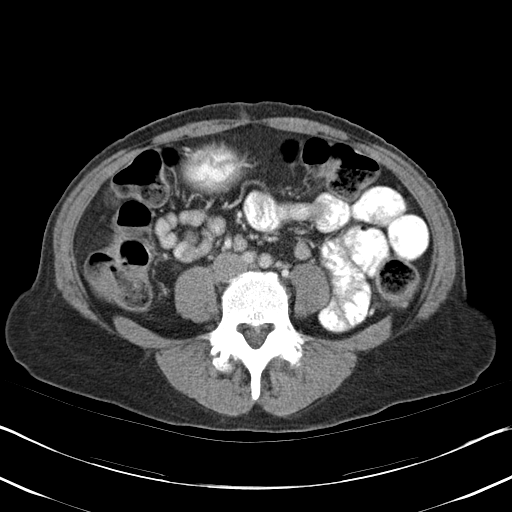
[im 48/86  soft-tissue]
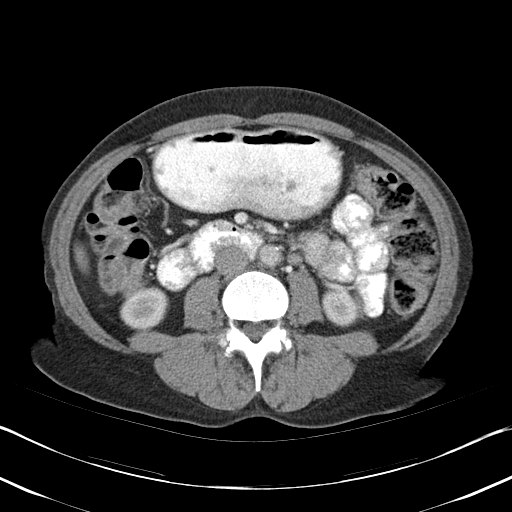
[im 57/86  soft-tissue]
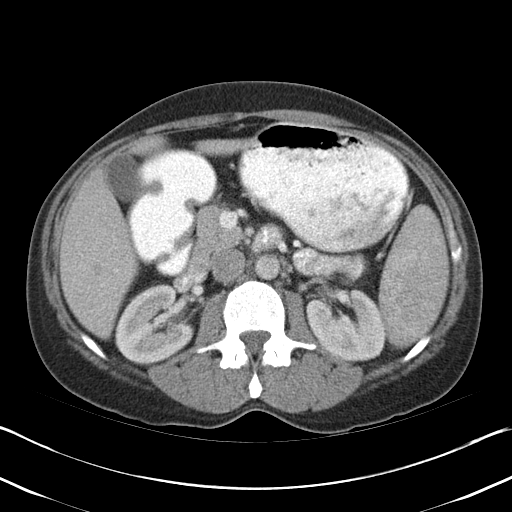
[im 57/86  bone]
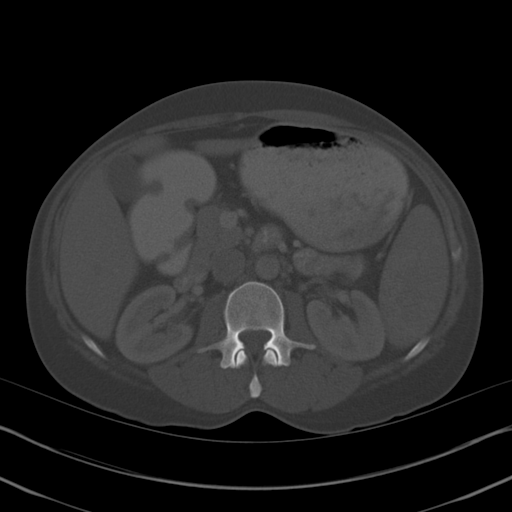
[im 62/86  soft-tissue]
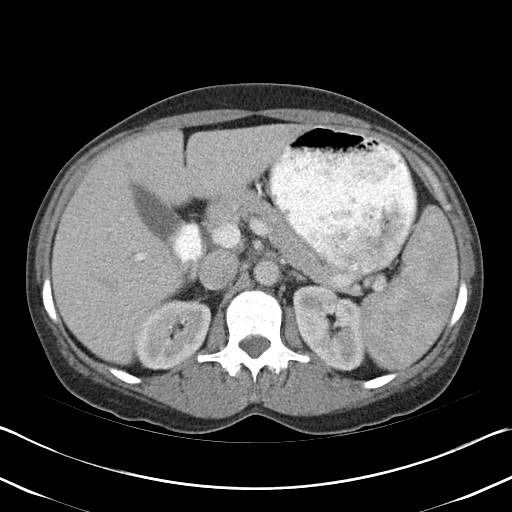
[im 67/86  soft-tissue]
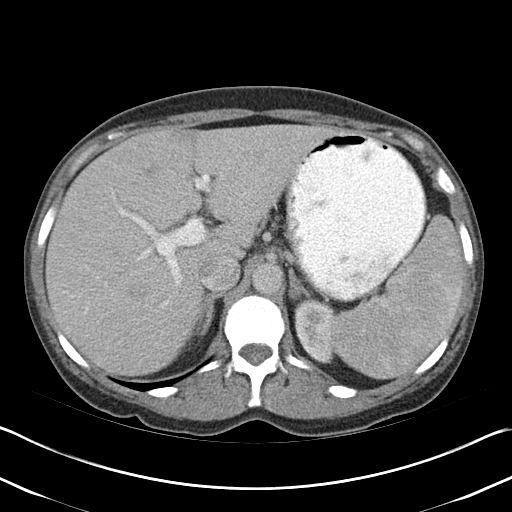
[im 76/86  soft-tissue]
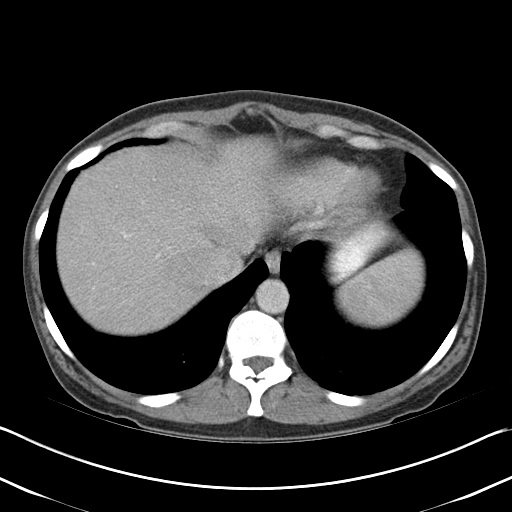
[im 81/86  soft-tissue]
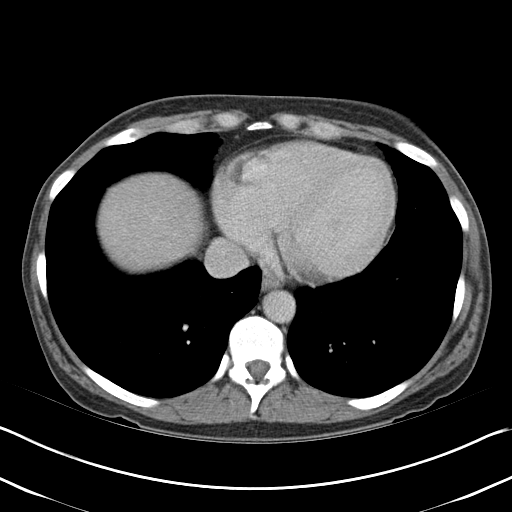

[Series 4: abd_pel_with 3.0 spo cor · coronal · 0.63mm/px · 3 of 76 slices shown]
[im 26/76  soft-tissue]
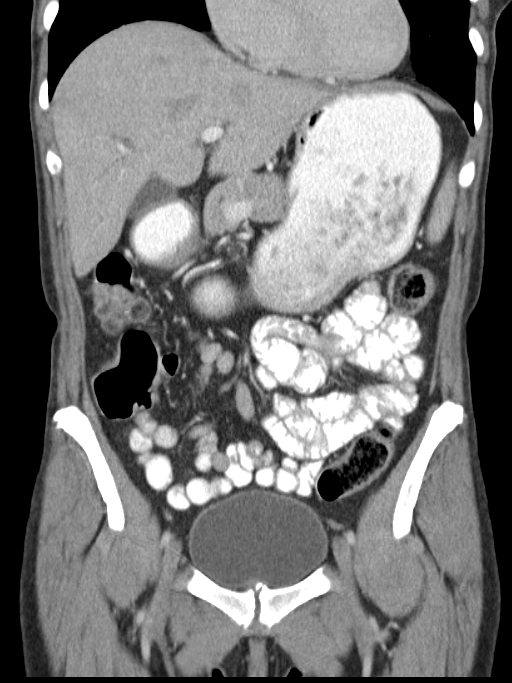
[im 34/76  soft-tissue]
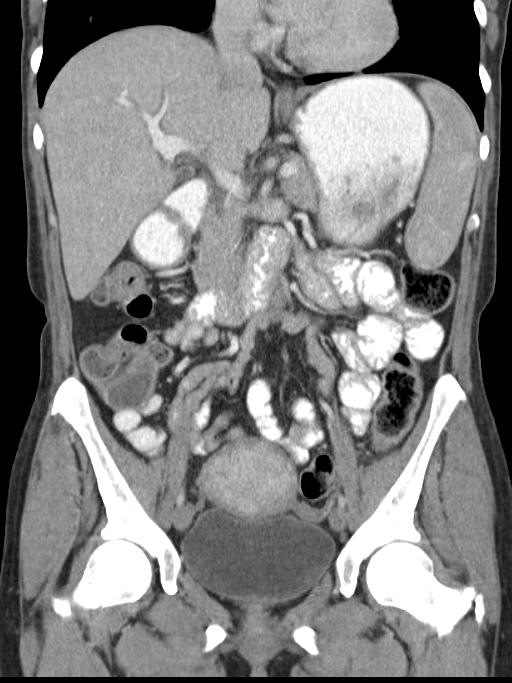
[im 42/76  soft-tissue]
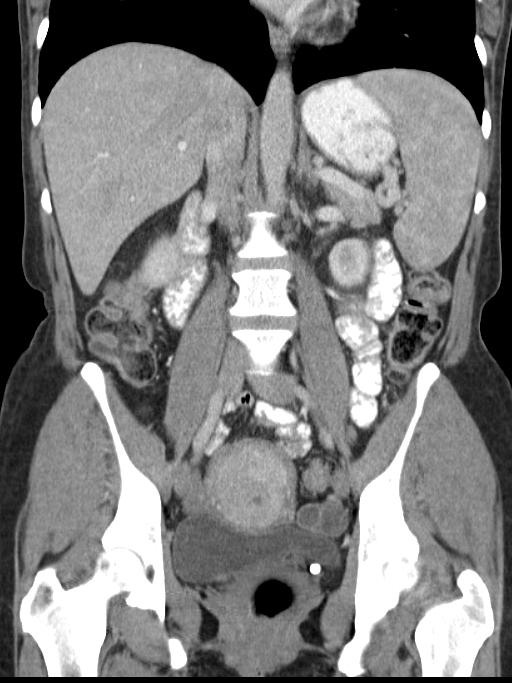

[16 of 46 positions shown; findings below may reference images not displayed]

FINDINGS: The liver, spleen, pancreas, adrenal glands and kidneys
are within normal limits.  There is stable appearance of the
gallbladder with suggestion of an area of focally increased
attenuation adjacent to or within the tip of the gallbladder.   No
evidence of gallbladder distention, inflammation or biliary ductal
dilatation.

Bowel loops are normal in caliber and show no evidence of
inflammation or obstruction.  The appendix is visualized and
normal.  No free fluid or abnormal fluid collections.  Similar
appearance of the uterus and adnexal regions with follicular cysts
identified on the left.  The bladder is unremarkable.  No hernias
are identified.  No masses or enlarged lymph nodes are seen.  Bony
structures are unremarkable.
IMPRESSION: No acute findings.  Stable appearance by CT of an area of increased
attenuation located adjacent to the tip of the gallbladder and left-
sided adnexal cysts.

## 2014-10-19 ENCOUNTER — Other Ambulatory Visit (HOSPITAL_COMMUNITY): Payer: Self-pay | Admitting: *Deleted

## 2014-10-19 DIAGNOSIS — R921 Mammographic calcification found on diagnostic imaging of breast: Secondary | ICD-10-CM

## 2014-10-31 ENCOUNTER — Other Ambulatory Visit (HOSPITAL_COMMUNITY): Payer: Self-pay | Admitting: *Deleted

## 2014-10-31 ENCOUNTER — Ambulatory Visit (HOSPITAL_COMMUNITY): Payer: PRIVATE HEALTH INSURANCE

## 2014-10-31 ENCOUNTER — Ambulatory Visit (HOSPITAL_COMMUNITY)
Admission: RE | Admit: 2014-10-31 | Discharge: 2014-10-31 | Disposition: A | Discharge status: Home / Self Care | Payer: PRIVATE HEALTH INSURANCE | Source: Ambulatory Visit | Attending: *Deleted | Admitting: *Deleted

## 2014-10-31 DIAGNOSIS — R921 Mammographic calcification found on diagnostic imaging of breast: Secondary | ICD-10-CM | POA: Diagnosis not present

## 2014-10-31 DIAGNOSIS — R928 Other abnormal and inconclusive findings on diagnostic imaging of breast: Secondary | ICD-10-CM | POA: Insufficient documentation

## 2014-11-12 ENCOUNTER — Emergency Department (HOSPITAL_COMMUNITY): Payer: PRIVATE HEALTH INSURANCE

## 2014-11-12 ENCOUNTER — Emergency Department (HOSPITAL_COMMUNITY)
Admission: EM | Admit: 2014-11-12 | Discharge: 2014-11-12 | Disposition: A | Discharge status: Home / Self Care | Payer: PRIVATE HEALTH INSURANCE | Attending: Emergency Medicine | Admitting: Emergency Medicine

## 2014-11-12 ENCOUNTER — Encounter (HOSPITAL_COMMUNITY): Payer: Self-pay | Admitting: Emergency Medicine

## 2014-11-12 DIAGNOSIS — K219 Gastro-esophageal reflux disease without esophagitis: Secondary | ICD-10-CM | POA: Insufficient documentation

## 2014-11-12 DIAGNOSIS — Z862 Personal history of diseases of the blood and blood-forming organs and certain disorders involving the immune mechanism: Secondary | ICD-10-CM | POA: Insufficient documentation

## 2014-11-12 DIAGNOSIS — Z791 Long term (current) use of non-steroidal anti-inflammatories (NSAID): Secondary | ICD-10-CM | POA: Insufficient documentation

## 2014-11-12 DIAGNOSIS — Y998 Other external cause status: Secondary | ICD-10-CM | POA: Insufficient documentation

## 2014-11-12 DIAGNOSIS — Z79899 Other long term (current) drug therapy: Secondary | ICD-10-CM | POA: Insufficient documentation

## 2014-11-12 DIAGNOSIS — S63502A Unspecified sprain of left wrist, initial encounter: Secondary | ICD-10-CM | POA: Insufficient documentation

## 2014-11-12 DIAGNOSIS — Y9389 Activity, other specified: Secondary | ICD-10-CM | POA: Insufficient documentation

## 2014-11-12 DIAGNOSIS — W010XXA Fall on same level from slipping, tripping and stumbling without subsequent striking against object, initial encounter: Secondary | ICD-10-CM | POA: Insufficient documentation

## 2014-11-12 DIAGNOSIS — Y9289 Other specified places as the place of occurrence of the external cause: Secondary | ICD-10-CM | POA: Insufficient documentation

## 2014-11-12 DIAGNOSIS — J449 Chronic obstructive pulmonary disease, unspecified: Secondary | ICD-10-CM | POA: Insufficient documentation

## 2014-11-12 DIAGNOSIS — I10 Essential (primary) hypertension: Secondary | ICD-10-CM | POA: Insufficient documentation

## 2014-11-12 MED ORDER — IBUPROFEN 800 MG PO TABS: 800.0000 mg | ORAL_TABLET | Freq: Three times a day (TID) | ORAL | Status: DC

## 2014-11-12 MED ORDER — HYDROCODONE-ACETAMINOPHEN 5-325 MG PO TABS: ORAL_TABLET | ORAL | Status: DC

## 2014-11-12 MED ORDER — HYDROCODONE-ACETAMINOPHEN 5-325 MG PO TABS
1.0000 | ORAL_TABLET | Freq: Once | ORAL | Status: AC
Start: 2014-11-12 — End: 2014-11-12
  Administered 2014-11-12: 1 via ORAL
  Filled 2014-11-12: qty 1

## 2014-11-12 NOTE — ED Notes (Signed)
Patient c/o left wrist pain. Per patient slipped on grandchild's toy and tried to catch herself. Per patient has sprained wrist in past. No obvious deformity noted.

## 2014-11-12 NOTE — ED Provider Notes (Signed)
CSN: [id number]     Arrival date & time 11/12/14  1800 History  By signing my name below, I, Ronney Lion, attest that this documentation has been prepared under the direction and in the presence of Elyzabeth Goatley, PA-C.   Electronically Signed: Ronney Lion, ED Scribe. 11/12/2014. 6:32 PM.   Chief Complaint  Patient presents with  . Wrist Injury   Patient is a 49 y.o. female presenting with wrist injury. The history is provided by the patient. No language interpreter was used.  Wrist Injury Location:  Wrist Time since incident:  20 minutes Injury: yes   Mechanism of injury: fall   Fall:    Fall occurred:  Tripped   Height of fall:  Standing   Point of impact:  Hands   Entrapped after fall: no   Wrist location:  L wrist Pain details:    Quality:  Throbbing   Radiates to:  Does not radiate   Severity:  Moderate   Onset quality:  Sudden   Timing:  Constant   Progression:  Unchanged Chronicity:  New Relieved by:  None tried Worsened by:  Nothing tried Ineffective treatments:  None tried Associated symptoms: no neck pain    HPI Comments: [name] is a 49 y.o. female with a history of hypertension, COPD, anemia, and GERD, who presents to the Emergency Department complaining of left wrist pain that began 20 minutes ago, after patient had slipped on her grandchild's toy and tried to catch herself with her hands. She denies head injury, neck pain or LOC. Patient notes a prior history of sprained left wrist and worries that she may have sprained it again. Flexing her wrist exacerbates the pain. She denies any elbow pain or shoulder pain.   Patient also notes her blood pressure is high today, likely because of stressors involving her family at home. Her hypertension is currently being followed by her PCP. She recently had her dosage of HTN medication increased to 10 mg lisinopril, which she took this morning. She denies CP, shortness of breath, and dizziness  Past Medical History   Diagnosis Date  . COPD (chronic obstructive pulmonary disease) (HCC)   . Anemia   . Pleurisy   . GERD (gastroesophageal reflux disease)   . Hypertension    Past Surgical History  Procedure Laterality Date  . Tubal ligation    . Cyst removed from right face    . Esophagogastroduodenoscopy  11/07/2010    Procedure: ESOPHAGOGASTRODUODENOSCOPY (EGD);  Surgeon: Corbin Ade, MD;  Location: [facility];  Service: Endoscopy;  Laterality: N/A;  . Dilitation & currettage/hystroscopy with thermachoice ablation N/A 11/17/2012    Procedure: DILATATION & CURETTAGE/HYSTEROSCOPY WITH THERMACHOICE ABLATION;  Surgeon: Lazaro Arms, MD;  Location: [facility];  Service: Gynecology;  Laterality: N/A;  Total D5 in 21ml, total D5 out 21ml; temp 86-87 degrees Celcius; total time 9 minutes and 36 seconds;   . Ablation     Family History  Problem Relation Age of Onset  . Colon cancer Father 67    deceased secondary to kidney failure  . Diabetes Father   . Colon cancer Brother 19    recent diagnosis (2012). Finishing chemotherapy.  . Breast cancer Maternal Aunt   . Diabetes Mother   . Diabetes Brother   . Hypertension Brother   . Diabetes Maternal Grandmother    Social History  Substance Use Topics  . Smoking status: Current Every Day Smoker -- 0.50 packs/day for 20 years  Types: Cigarettes  . Smokeless tobacco: Never Used  . Alcohol Use: No   OB History    Gravida Para Term Preterm AB TAB SAB Ectopic Multiple Living   Review of Systems  Musculoskeletal: Positive for arthralgias (left wrist pain). Negative for joint swelling and neck pain.  Skin: Negative for color change and wound.  Neurological: Negative for dizziness, weakness and numbness.  All other systems reviewed and are negative.  Allergies  Tramadol and Tylenol  Home Medications   Prior to Admission medications   Medication Sig Start Date End Date Taking? Authorizing Provider  ibuprofen (ADVIL,MOTRIN) 600  MG tablet Take 1 tablet (600 mg total) by mouth every 6 (six) hours as needed. Patient not taking: Reported on 08/09/2014 06/28/14   Siddhant Hashemi, PA-C  lisinopril (PRINIVIL,ZESTRIL) 5 MG tablet Take 5 mg by mouth daily.    Historical Provider, MD  meloxicam (MOBIC) 7.5 MG tablet Take 1-2 tablets (7.5-15 mg total) by mouth daily. 08/09/14   Burgess Amor, PA-C  omeprazole (PRILOSEC) 20 MG capsule Take 20 mg by mouth daily.    Historical Provider, MD   BP 188/93 mmHg  Pulse 87  Temp(Src) 97.8 F (36.6 C) (Oral)  Resp 16  Ht  (1.6 m)  Wt 136 lb (61.689 kg)  BMI 24.10 kg/m2  SpO2 97% Physical Exam  Constitutional: She is oriented to person, place, and time. She appears well-developed and well-nourished. No distress.  HENT:  Head: Normocephalic and atraumatic.  Neck: Normal range of motion. Neck supple. No tracheal deviation present.  Cardiovascular: Normal rate, regular rhythm and normal heart sounds.   Pulmonary/Chest: Effort normal. No respiratory distress.  Musculoskeletal: Normal range of motion. She exhibits tenderness. She exhibits no edema.  TTP of the distal and lateral left wrist, without bony deformity or edema. Pulses and sensation intact. No proximal tenderness. Compartments soft  Neurological: She is alert and oriented to person, place, and time.  Skin: Skin is warm and dry.  Psychiatric: She has a normal mood and affect. Her behavior is normal.  Nursing note and vitals reviewed.   ED Course  Procedures (including critical care time)  DIAGNOSTIC STUDIES: Oxygen Saturation is 97% on RA, normal by my interpretation.    COORDINATION OF CARE: 6:23 PM - Discussed treatment plan with pt at bedside which includes awaiting XR results. Pt verbalized understanding and agreed to plan.   Imaging Review Dg Wrist Complete Left  11/12/2014  CLINICAL DATA:  Tripped over coli on floor. Left wrist injury and pain. Initial encounter. EXAM: LEFT WRIST - COMPLETE 3+ VIEW COMPARISON:   None. FINDINGS: There is no evidence of fracture or dislocation. There is no evidence of arthropathy or other focal bone abnormality. Soft tissues are unremarkable. IMPRESSION: Negative. Electronically Signed   By: Myles Rosenthal M.D.   On: 11/12/2014 18:23   I have personally reviewed and evaluated these images and lab results as part of my medical decision-making.  MDM   Final diagnoses:  Wrist sprain, left, initial encounter   Pt is well appearing.  Mildly hypertensive, but asymptomatic at present, only complaint is wrist pain.  XR neg for fx.  Exam c/w sprain.  She agrees to symptomatic tx and PMD or ortho f/u if needed  Wrist splint applied, pain improved, remains NV intact.     I personally performed the services described in this documentation, which was scribed in my presence. The recorded  information has been reviewed and is accurate.     Pauline Ausammy Epsie Walthall, PA-C 11/12/14 1856  Samuel JesterKathleen McManus, DO 11/15/14 2238

## 2014-11-12 NOTE — Discharge Instructions (Signed)
Wrist Sprain °A wrist sprain is a stretch or tear in the strong, fibrous tissues (ligaments) that connect your wrist bones. The ligaments of your wrist may be easily sprained. There are three types of wrist sprains. °· Grade 1. The ligament is not stretched or torn, but the sprain causes pain. °· Grade 2. The ligament is stretched or partially torn. You may be able to move your wrist, but not very much. °· Grade 3. The ligament or muscle completely tears. You may find it difficult or extremely painful to move your wrist even a little. °CAUSES °Often, wrist sprains are a result of a fall or an injury. The force of the impact causes the fibers of your ligament to stretch too much or tear. Common causes of wrist sprains include: °· Overextending your wrist while catching a ball with your hands. °· Repetitive or strenuous extension or bending of your wrist. °· Landing on your hand during a fall. °RISK FACTORS °· Having previous wrist injuries. °· Playing contact sports, such as boxing or wrestling. °· Participating in activities in which falling is common. °· Having poor wrist strength and flexibility. °SIGNS AND SYMPTOMS °· Wrist pain. °· Wrist tenderness. °· Inflammation or bruising of the wrist area. °· Hearing a "pop" or feeling a tear at the time of the injury. °· Decreased wrist movement due to pain, stiffness, or weakness. °DIAGNOSIS °Your health care provider will examine your wrist. In some cases, an X-ray will be taken to make sure you did not break any bones. If your health care provider thinks that you tore a ligament, he or she may order an MRI of your wrist. °TREATMENT °Treatment involves resting and icing your wrist. You may also need to take pain medicines to help lessen pain and inflammation. Your health care provider may recommend keeping your wrist still (immobilized) with a splint to help your sprain heal. When the splint is no longer necessary, you may need to perform strengthening and stretching  exercises. These exercises help you to regain strength and full range of motion in your wrist. Surgery is not usually needed for wrist sprains unless the ligament completely tears. °HOME CARE INSTRUCTIONS °· Rest your wrist. Do not do things that cause pain. °· Wear your wrist splint as directed by your health care provider. °· Take medicines only as directed by your health care provider. °· To ease pain and swelling, apply ice to the injured area. °¨ Put ice in a plastic bag. °¨ Place a towel between your skin and the bag. °¨ Leave the ice on for 20 minutes, 2-3 times a day. °SEEK MEDICAL CARE IF: °· Your pain, discomfort, or swelling gets worse even with treatment. °· You feel sudden numbness in your hand. °  °This information is not intended to replace advice given to you by your health care provider. Make sure you discuss any questions you have with your health care provider. °  °Document Released: 09/16/2013 Document Reviewed: 09/16/2013 °[employer] Interactive Patient Education ©2016 [employer] ° °

## 2014-12-09 ENCOUNTER — Emergency Department (HOSPITAL_COMMUNITY)
Admission: EM | Admit: 2014-12-09 | Discharge: 2014-12-09 | Disposition: A | Discharge status: Home / Self Care | Payer: PRIVATE HEALTH INSURANCE | Attending: Emergency Medicine | Admitting: Emergency Medicine

## 2014-12-09 ENCOUNTER — Encounter (HOSPITAL_COMMUNITY): Payer: Self-pay | Admitting: Emergency Medicine

## 2014-12-09 ENCOUNTER — Emergency Department (HOSPITAL_COMMUNITY): Payer: PRIVATE HEALTH INSURANCE

## 2014-12-09 DIAGNOSIS — S5012XA Contusion of left forearm, initial encounter: Secondary | ICD-10-CM

## 2014-12-09 DIAGNOSIS — Z862 Personal history of diseases of the blood and blood-forming organs and certain disorders involving the immune mechanism: Secondary | ICD-10-CM | POA: Insufficient documentation

## 2014-12-09 DIAGNOSIS — Y990 Civilian activity done for income or pay: Secondary | ICD-10-CM | POA: Insufficient documentation

## 2014-12-09 DIAGNOSIS — K219 Gastro-esophageal reflux disease without esophagitis: Secondary | ICD-10-CM | POA: Insufficient documentation

## 2014-12-09 DIAGNOSIS — W208XXA Other cause of strike by thrown, projected or falling object, initial encounter: Secondary | ICD-10-CM | POA: Insufficient documentation

## 2014-12-09 DIAGNOSIS — Z79899 Other long term (current) drug therapy: Secondary | ICD-10-CM | POA: Insufficient documentation

## 2014-12-09 DIAGNOSIS — J449 Chronic obstructive pulmonary disease, unspecified: Secondary | ICD-10-CM | POA: Insufficient documentation

## 2014-12-09 DIAGNOSIS — Y9389 Activity, other specified: Secondary | ICD-10-CM | POA: Insufficient documentation

## 2014-12-09 DIAGNOSIS — Y9289 Other specified places as the place of occurrence of the external cause: Secondary | ICD-10-CM | POA: Insufficient documentation

## 2014-12-09 DIAGNOSIS — S5011XA Contusion of right forearm, initial encounter: Secondary | ICD-10-CM | POA: Insufficient documentation

## 2014-12-09 DIAGNOSIS — I10 Essential (primary) hypertension: Secondary | ICD-10-CM | POA: Insufficient documentation

## 2014-12-09 DIAGNOSIS — Z720 Tobacco use: Secondary | ICD-10-CM | POA: Insufficient documentation

## 2014-12-09 MED ORDER — IBUPROFEN 800 MG PO TABS
800.0000 mg | ORAL_TABLET | Freq: Once | ORAL | Status: AC
  Administered 2014-12-09: 800 mg via ORAL
  Filled 2014-12-09: qty 1

## 2014-12-09 MED ORDER — NAPROXEN 375 MG PO TABS
375.0000 mg | ORAL_TABLET | Freq: Two times a day (BID) | ORAL | Status: DC
Start: 2014-12-09 — End: 2015-06-24

## 2014-12-09 NOTE — Discharge Instructions (Signed)
Hypertension Hypertension, commonly called high blood pressure, is when the force of blood pumping through your arteries is too strong. Your arteries are the blood vessels that carry blood from your heart throughout your body. A blood pressure reading consists of a higher number over a lower number, such as 110/72. The higher number (systolic) is the pressure inside your arteries when your heart pumps. The lower number (diastolic) is the pressure inside your arteries when your heart relaxes. Ideally you want your blood pressure below 120/80. Hypertension forces your heart to work harder to pump blood. Your arteries may become narrow or stiff. Having untreated or uncontrolled hypertension can cause heart attack, stroke, kidney disease, and other problems. RISK FACTORS Some risk factors for high blood pressure are controllable. Others are not.  Risk factors you cannot control include:   Race. You may be at higher risk if you are African American.  Age. Risk increases with age.  Gender. Men are at higher risk than women before age 45 years. After age 65, women are at higher risk than men. Risk factors you can control include:  Not getting enough exercise or physical activity.  Being overweight.  Getting too much fat, sugar, calories, or salt in your diet.  Drinking too much alcohol. SIGNS AND SYMPTOMS Hypertension does not usually cause signs or symptoms. Extremely high blood pressure (hypertensive crisis) may cause headache, anxiety, shortness of breath, and nosebleed. DIAGNOSIS To check if you have hypertension, your health care provider will measure your blood pressure while you are seated, with your arm held at the level of your heart. It should be measured at least twice using the same arm. Certain conditions can cause a difference in blood pressure between your right and left arms. A blood pressure reading that is higher than normal on one occasion does not mean that you need treatment. If  it is not clear whether you have high blood pressure, you may be asked to return on a different day to have your blood pressure checked again. Or, you may be asked to monitor your blood pressure at home for 1 or more weeks. TREATMENT Treating high blood pressure includes making lifestyle changes and possibly taking medicine. Living a healthy lifestyle can help lower high blood pressure. You may need to change some of your habits. Lifestyle changes may include:  Following the DASH diet. This diet is high in fruits, vegetables, and whole grains. It is low in salt, red meat, and added sugars.  Keep your sodium intake below 2,300 mg per day.  Getting at least 30-45 minutes of aerobic exercise at least 4 times per week.  Losing weight if necessary.  Not smoking.  Limiting alcoholic beverages.  Learning ways to reduce stress. Your health care provider may prescribe medicine if lifestyle changes are not enough to get your blood pressure under control, and if one of the following is true:  You are 18-59 years of age and your systolic blood pressure is above 140.  You are 60 years of age or older, and your systolic blood pressure is above 150.  Your diastolic blood pressure is above 90.  You have diabetes, and your systolic blood pressure is over 140 or your diastolic blood pressure is over 90.  You have kidney disease and your blood pressure is above 140/90.  You have heart disease and your blood pressure is above 140/90. Your personal target blood pressure may vary depending on your medical conditions, your age, and other factors. HOME CARE INSTRUCTIONS    Have your blood pressure rechecked as directed by your health care provider.   Take medicines only as directed by your health care provider. Follow the directions carefully. Blood pressure medicines must be taken as prescribed. The medicine does not work as well when you skip doses. Skipping doses also puts you at risk for  problems.  Do not smoke.   Monitor your blood pressure at home as directed by your health care provider. SEEK MEDICAL CARE IF:   You think you are having a reaction to medicines taken.  You have recurrent headaches or feel dizzy.  You have swelling in your ankles.  You have trouble with your vision. SEEK IMMEDIATE MEDICAL CARE IF:  You develop a severe headache or confusion.  You have unusual weakness, numbness, or feel faint.  You have severe chest or abdominal pain.  You vomit repeatedly.  You have trouble breathing. MAKE SURE YOU:   Understand these instructions.  Will watch your condition.  Will get help right away if you are not doing well or get worse.   This information is not intended to replace advice given to you by your health care provider. Make sure you discuss any questions you have with your health care provider.   Document Released: 01/13/2005 Document Revised: 05/30/2014 Document Reviewed: 11/05/2012 Elsevier Interactive Patient Education 2016 Elsevier Inc. DASH Eating Plan DASH stands for "Dietary Approaches to Stop Hypertension." The DASH eating plan is a healthy eating plan that has been shown to reduce high blood pressure (hypertension). Additional health benefits may include reducing the risk of type 2 diabetes mellitus, heart disease, and stroke. The DASH eating plan may also help with weight loss. WHAT DO I NEED TO KNOW ABOUT THE DASH EATING PLAN? For the DASH eating plan, you will follow these general guidelines:  Choose foods with a percent daily value for sodium of less than 5% (as listed on the food label).  Use salt-free seasonings or herbs instead of table salt or sea salt.  Check with your health care provider or pharmacist before using salt substitutes.  Eat lower-sodium products, often labeled as "lower sodium" or "no salt added."  Eat fresh foods.  Eat more vegetables, fruits, and low-fat dairy products.  Choose whole grains.  Look for the word "whole" as the first word in the ingredient list.  Choose fish and skinless chicken or turkey more often than red meat. Limit fish, poultry, and meat to 6 oz (170 g) each day.  Limit sweets, desserts, sugars, and sugary drinks.  Choose heart-healthy fats.  Limit cheese to 1 oz (28 g) per day.  Eat more home-cooked food and less restaurant, buffet, and fast food.  Limit fried foods.  Cook foods using methods other than frying.  Limit canned vegetables. If you do use them, rinse them well to decrease the sodium.  When eating at a restaurant, ask that your food be prepared with less salt, or no salt if possible. WHAT FOODS CAN I EAT? Seek help from a dietitian for individual calorie needs. Grains Whole grain or whole wheat bread. Brown rice. Whole grain or whole wheat pasta. Quinoa, bulgur, and whole grain cereals. Low-sodium cereals. Corn or whole wheat flour tortillas. Whole grain cornbread. Whole grain crackers. Low-sodium crackers. Vegetables Fresh or frozen vegetables (raw, steamed, roasted, or grilled). Low-sodium or reduced-sodium tomato and vegetable juices. Low-sodium or reduced-sodium tomato sauce and paste. Low-sodium or reduced-sodium canned vegetables.  Fruits All fresh, canned (in natural juice), or frozen fruits. Meat and Other   Other Protein Products Ground beef (85% or leaner), grass-fed beef, or beef trimmed of fat. Skinless chicken or [place]. Ground chicken or [place]. Pork trimmed of fat. All fish and seafood. Eggs. Dried beans, peas, or lentils. Unsalted nuts and seeds. Unsalted canned beans. Dairy Low-fat dairy products, such as skim or 1% milk, 2% or reduced-fat cheeses, low-fat ricotta or cottage cheese, or plain low-fat yogurt. Low-sodium or reduced-sodium cheeses. Fats and Oils Tub margarines without trans fats. Light or reduced-fat mayonnaise and salad dressings (reduced sodium). Avocado. Safflower, olive, or canola oils. Natural peanut or  almond butter. Other Unsalted popcorn and pretzels. The items listed above may not be a complete list of recommended foods or beverages. Contact your dietitian for more options. WHAT FOODS ARE NOT RECOMMENDED? Grains White bread. White pasta. White rice. Refined cornbread. Bagels and croissants. Crackers that contain trans fat. Vegetables Creamed or fried vegetables. Vegetables in a cheese sauce. Regular canned vegetables. Regular canned tomato sauce and paste. Regular tomato and vegetable juices. Fruits Dried fruits. Canned fruit in light or heavy syrup. Fruit juice. Meat and Other Protein Products Fatty cuts of meat. Ribs, chicken wings, bacon, sausage, bologna, salami, chitterlings, fatback, hot dogs, bratwurst, and packaged luncheon meats. Salted nuts and seeds. Canned beans with salt. Dairy Whole or 2% milk, cream, half-and-half, and cream cheese. Whole-fat or sweetened yogurt. Full-fat cheeses or blue cheese. Nondairy creamers and whipped toppings. Processed cheese, cheese spreads, or cheese curds. Condiments Onion and garlic salt, seasoned salt, table salt, and sea salt. Canned and packaged gravies. Worcestershire sauce. Tartar sauce. Barbecue sauce. Teriyaki sauce. Soy sauce, including reduced sodium. Steak sauce. Fish sauce. Oyster sauce. Cocktail sauce. Horseradish. Ketchup and mustard. Meat flavorings and tenderizers. Bouillon cubes. Hot sauce. Tabasco sauce. Marinades. Taco seasonings. Relishes. Fats and Oils Butter, stick margarine, lard, shortening, ghee, and bacon fat. Coconut, palm kernel, or palm oils. Regular salad dressings. Other Pickles and olives. Salted popcorn and pretzels. The items listed above may not be a complete list of foods and beverages to avoid. Contact your dietitian for more information. WHERE CAN I FIND MORE INFORMATION? National Heart, Lung, and Blood Institute: CableProm[employer].itwww.nhlbi.nih.gov/health/health-topics/topics/dash/   This information is not intended to  replace advice given to you by your health care provider. Make sure you discuss any questions you have with your health care provider.   Document Released: 01/02/2011 Document Revised: 02/03/2014 Document Reviewed: 11/17/2012 Elsevier Interactive Patient Education 2016 Elsevier Inc.  Contusion A contusion is a deep bruise. Contusions are the result of a blunt injury to tissues and muscle fibers under the skin. The injury causes bleeding under the skin. The skin overlying the contusion may turn blue, purple, or yellow. Minor injuries will give you a painless contusion, but more severe contusions may stay painful and swollen for a few weeks.  CAUSES  This condition is usually caused by a blow, trauma, or direct force to an area of the body. SYMPTOMS  Symptoms of this condition include:  Swelling of the injured area.  Pain and tenderness in the injured area.  Discoloration. The area may have redness and then turn blue, purple, or yellow. DIAGNOSIS  This condition is diagnosed based on a physical exam and medical history. An X-ray, CT scan, or MRI may be needed to determine if there are any associated injuries, such as broken bones (fractures). TREATMENT  Specific treatment for this condition depends on what area of the body was injured. In general, the best treatment for a contusion is resting, icing, applying  pressure to (compression), and elevating the injured area. This is often called the RICE strategy. Over-the-counter anti-inflammatory medicines may also be recommended for pain control.  HOME CARE INSTRUCTIONS   Rest the injured area.  If directed, apply ice to the injured area:  Put ice in a plastic bag.  Place a towel between your skin and the bag.  Leave the ice on for 20 minutes, 2-3 times per day.  If directed, apply light compression to the injured area using an elastic bandage. Make sure the bandage is not wrapped too tightly. Remove and reapply the bandage as directed by  your health care provider.  If possible, raise (elevate) the injured area above the level of your heart while you are sitting or lying down.  Take over-the-counter and prescription medicines only as told by your health care provider. SEEK MEDICAL CARE IF:  Your symptoms do not improve after several days of treatment.  Your symptoms get worse.  You have difficulty moving the injured area. SEEK IMMEDIATE MEDICAL CARE IF:   You have severe pain.  You have numbness in a hand or foot.  Your hand or foot turns pale or cold.   This information is not intended to replace advice given to you by your health care provider. Make sure you discuss any questions you have with your health care provider.   Document Released: 10/23/2004 Document Revised: 10/04/2014 Document Reviewed: 05/31/2014 Elsevier Interactive Patient Education [employer]

## 2014-12-09 NOTE — ED Notes (Signed)
Injury to right arm working on car.  Hood fell on right arm.  Rates pain 8/10.

## 2014-12-09 NOTE — ED Provider Notes (Signed)
CSN: [id number]     Arrival date & time 12/09/14  1230 History   First MD Initiated Contact with Patient 12/09/14 1254     Chief Complaint  Patient presents with  . 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 She is right-hand dominant. She complains of pain in the upper right forearm, pain with movement of the fingers. She denies joint swelling, numbness or tingling. She denies hitting her head or losing consciousness.  Past Medical History  Diagnosis Date  . COPD (chronic obstructive pulmonary disease) (HCC)   . Anemia   . Pleurisy   . GERD (gastroesophageal reflux disease)   . Hypertension    Past Surgical History  Procedure Laterality Date  . Tubal ligation    . Cyst removed from right face    . Esophagogastroduodenoscopy  11/07/2010    Procedure: ESOPHAGOGASTRODUODENOSCOPY (EGD);  Surgeon: Corbin Ade, MD;  Location: [facility];  Service: Endoscopy;  Laterality: N/A;  . Dilitation & currettage/hystroscopy with thermachoice ablation N/A 11/17/2012    Procedure: DILATATION & CURETTAGE/HYSTEROSCOPY WITH THERMACHOICE ABLATION;  Surgeon: Lazaro Arms, MD;  Location: [facility];  Service: Gynecology;  Laterality: N/A;  Total D5 in 21ml, total D5 out 21ml; temp 86-87 degrees Celcius; total time 9 minutes and 36 seconds;   . Ablation     Family History  Problem Relation Age of Onset  . Colon cancer Father 73    deceased secondary to kidney failure  . Diabetes Father   . Colon cancer Brother 33    recent diagnosis (2012). Finishing chemotherapy.  . Breast cancer Maternal Aunt   . Diabetes Mother   . Diabetes Brother   . Hypertension Brother   . Diabetes Maternal Grandmother    Social History  Substance Use Topics  . Smoking status: Current Every Day Smoker -- 0.50 packs/day for 20 years     Types: Cigarettes  . Smokeless tobacco: Never Used  . Alcohol Use: No   OB History    Gravida Para Term Preterm AB TAB SAB Ectopic Multiple Living   Review of Systems  Musculoskeletal: Positive for myalgias.  Neurological: Negative for weakness and numbness.  Hematological: Does not bruise/bleed easily.      Allergies  Tramadol and Tylenol  Home Medications   Prior to Admission medications   Medication Sig Start Date End Date Taking? Authorizing Provider  lisinopril (PRINIVIL,ZESTRIL) 10 MG tablet Take 10 mg by mouth daily.    Historical Provider, MD  naproxen (NAPROSYN) 375 MG tablet Take 1 tablet (375 mg total) by mouth 2 (two) times daily. 12/09/14   Arthor Captain, PA-C  omeprazole (PRILOSEC) 20 MG capsule Take 20 mg by mouth daily.    Historical Provider, MD   BP 149/99 mmHg  Pulse 92  Temp(Src) 97.6 F (36.4 C)  Resp 18  Ht  (1.6 m)  Wt 140 lb (63.504 kg)  BMI 24.81 kg/m2  SpO2 95% Physical Exam  Constitutional: She is oriented to person, place, and time. She appears well-developed and well-nourished. No distress.  HENT:  Head: Normocephalic and atraumatic.  Eyes: Conjunctivae are normal. No scleral icterus.  Neck: Normal range of motion.  Cardiovascular: Normal rate, regular rhythm and normal heart sounds.  Exam reveals no gallop  and no friction rub.   No murmur heard. Pulmonary/Chest: Effort normal and breath sounds normal. No respiratory distress.  Abdominal: Soft. Bowel sounds are normal. She exhibits no distension and no mass. There is no tenderness. There is no guarding.  Musculoskeletal:  Hematoma of the proximal right forearm Able to wiggle all her fingers, strong and equal grip strengths. Neurovascularly intact. No bony tenderness, full range of motion of the elbow, shoulder and wrist.  Neurological: She is alert and oriented to person, place, and time.  Skin: Skin is warm and dry. She is not diaphoretic.    ED Course   Procedures (including critical care time) Labs Review Labs Reviewed - No data to display  Imaging Review Dg Forearm Right  12/09/2014  CLINICAL DATA:  Right arm injury, pain/swelling EXAM: RIGHT FOREARM - 2 VIEW COMPARISON:  None. FINDINGS: No fracture or dislocation is seen. The joint spaces are preserved. The visualized soft tissues are unremarkable. IMPRESSION: No fracture or dislocation is seen. Electronically Signed   By: Charline BillsSriyesh  Krishnan M.D.   On: 12/09/2014 13:14   I have personally reviewed and evaluated these images and lab results as part of my medical decision-making.   EKG Interpretation None      MDM   Final diagnoses:  Traumatic hematoma of forearm, left, initial encounter  Essential hypertension   BP 149/99 mmHg  Pulse 92  Temp(Src) 97.6 F (36.4 C)  Resp 18  Ht 5\' 3"  (1.6 m)  Wt 140 lb (63.504 kg)  BMI 24.81 kg/m2  SpO2 95%  Patient X-Ray negative for obvious fracture or dislocation. Pain managed in ED. Pt advised to follow up with orthopedics if symptoms persist for possibility of missed fracture diagnosis. Patient given brace while in ED, conservative therapy recommended and discussed. Patient will be dc home & is agreeable with above plan.     Arthor Captainbigail Garima Chronis, PA-C 12/09/14 1343  Mancel BaleElliott Wentz, MD 12/10/14 1535

## 2014-12-22 ENCOUNTER — Encounter (HOSPITAL_COMMUNITY): Payer: Self-pay | Admitting: Emergency Medicine

## 2014-12-22 ENCOUNTER — Emergency Department (HOSPITAL_COMMUNITY)
Admission: EM | Admit: 2014-12-22 | Discharge: 2014-12-22 | Disposition: A | Discharge status: Home / Self Care | Payer: PRIVATE HEALTH INSURANCE | Attending: Emergency Medicine | Admitting: Emergency Medicine

## 2014-12-22 DIAGNOSIS — M5432 Sciatica, left side: Secondary | ICD-10-CM | POA: Insufficient documentation

## 2014-12-22 DIAGNOSIS — I10 Essential (primary) hypertension: Secondary | ICD-10-CM | POA: Insufficient documentation

## 2014-12-22 DIAGNOSIS — Z791 Long term (current) use of non-steroidal anti-inflammatories (NSAID): Secondary | ICD-10-CM | POA: Insufficient documentation

## 2014-12-22 DIAGNOSIS — F17210 Nicotine dependence, cigarettes, uncomplicated: Secondary | ICD-10-CM | POA: Insufficient documentation

## 2014-12-22 DIAGNOSIS — Z79899 Other long term (current) drug therapy: Secondary | ICD-10-CM | POA: Insufficient documentation

## 2014-12-22 DIAGNOSIS — J449 Chronic obstructive pulmonary disease, unspecified: Secondary | ICD-10-CM | POA: Insufficient documentation

## 2014-12-22 DIAGNOSIS — Z8719 Personal history of other diseases of the digestive system: Secondary | ICD-10-CM | POA: Insufficient documentation

## 2014-12-22 DIAGNOSIS — Z862 Personal history of diseases of the blood and blood-forming organs and certain disorders involving the immune mechanism: Secondary | ICD-10-CM | POA: Insufficient documentation

## 2014-12-22 MED ORDER — HYDROCODONE-ACETAMINOPHEN 5-325 MG PO TABS: ORAL_TABLET | ORAL | Status: DC

## 2014-12-22 MED ORDER — CYCLOBENZAPRINE HCL 10 MG PO TABS: 10.0000 mg | ORAL_TABLET | Freq: Three times a day (TID) | ORAL | Status: DC | PRN

## 2014-12-22 NOTE — Discharge Instructions (Signed)
Hypertension Hypertension is another name for high blood pressure. High blood pressure forces your heart to work harder to pump blood. A blood pressure reading has two numbers, which includes a higher number over a lower number (example: 110/72). HOME CARE   Have your blood pressure rechecked by your doctor.  Only take medicine as told by your doctor. Follow the directions carefully. The medicine does not work as well if you skip doses. Skipping doses also puts you at risk for problems.  Do not smoke.  Monitor your blood pressure at home as told by your doctor. GET HELP IF:  You think you are having a reaction to the medicine you are taking.  You have repeat headaches or feel dizzy.  You have puffiness (swelling) in your ankles.  You have trouble with your vision. GET HELP RIGHT AWAY IF:   You get a very bad headache and are confused.  You feel weak, numb, or faint.  You get chest or belly (abdominal) pain.  You throw up (vomit).  You cannot breathe very well. MAKE SURE YOU:   Understand these instructions.  Will watch your condition.  Will get help right away if you are not doing well or get worse.   This information is not intended to replace advice given to you by your health care provider. Make sure you discuss any questions you have with your health care provider.   Document Released: 07/02/2007 Document Revised: 01/18/2013 Document Reviewed: 11/05/2012 Elsevier Interactive Patient Education 2016 Elsevier Inc.  Sciatica Sciatica is pain, weakness, numbness, or tingling along your sciatic nerve. The nerve starts in the lower back and runs down the back of each leg. Nerve damage or certain conditions pinch or put pressure on the sciatic nerve. This causes the pain, weakness, and other discomforts of sciatica. HOME CARE   Only take medicine as told by your doctor.  Apply ice to the affected area for 20 minutes. Do this 3-4 times a day for the first 48-72 hours.  Then try heat in the same way.  Exercise, stretch, or do your usual activities if these do not make your pain worse.  Go to physical therapy as told by your doctor.  Keep all doctor visits as told.  Do not wear high heels or shoes that are not supportive.  Get a firm mattress if your mattress is too soft to lessen pain and discomfort. GET HELP RIGHT AWAY IF:   You cannot control when you poop (bowel movement) or pee (urinate).  You have more weakness in your lower back, lower belly (pelvis), butt (buttocks), or legs.  You have redness or puffiness (swelling) of your back.  You have a burning feeling when you pee.  You have pain that gets worse when you lie down.  You have pain that wakes you from your sleep.  Your pain is worse than past pain.  Your pain lasts longer than 4 weeks.  You are suddenly losing weight without reason. MAKE SURE YOU:   Understand these instructions.  Will watch this condition.  Will get help right away if you are not doing well or get worse.   This information is not intended to replace advice given to you by your health care provider. Make sure you discuss any questions you have with your health care provider.   Document Released: 10/23/2007 Document Revised: 10/04/2014 Document Reviewed: 05/25/2011 Elsevier Interactive Patient Education [employer]

## 2014-12-22 NOTE — ED Provider Notes (Signed)
CSN: [id number]     Arrival date & time 12/22/14  0906 History   First MD Initiated Contact with Patient 12/22/14 [phone]     Chief Complaint  Patient presents with  . Back Pain     (Consider location/radiation/quality/duration/timing/severity/associated sxs/prior Treatment) HPI   [name] is a 49 y.o. female who presents to the Emergency Department complaining of left-sided low back pain that began yesterday after changing tires on a pickup truck. She states that she was bending and squatting at the time the pain began. She reports pain radiating into her left groin and thigh. Pain is worse with standing and weightbearing. Pain is somewhat relieved after anti-inflammatories and lying in the fetal position.  She denies abdominal pain, numbness or weakness to the lower extremities, fever chills, urinary or bowel changes. She states pain is similar to previous episodes of sciatica.   Past Medical History  Diagnosis Date  . COPD (chronic obstructive pulmonary disease) (HCC)   . Anemia   . Pleurisy   . GERD (gastroesophageal reflux disease)   . Hypertension    Past Surgical History  Procedure Laterality Date  . Tubal ligation    . Cyst removed from right face    . Esophagogastroduodenoscopy  11/07/2010    Procedure: ESOPHAGOGASTRODUODENOSCOPY (EGD);  Surgeon: Corbin Adeobert M Rourk, MD;  Location: [facility];  Service: Endoscopy;  Laterality: N/A;  . Dilitation & currettage/hystroscopy with thermachoice ablation N/A 11/17/2012    Procedure: DILATATION & CURETTAGE/HYSTEROSCOPY WITH THERMACHOICE ABLATION;  Surgeon: Lazaro ArmsLuther H Eure, MD;  Location: [facility];  Service: Gynecology;  Laterality: N/A;  Total D5 in 21ml, total D5 out 21ml; temp 86-87 degrees Celcius; total time 9 minutes and 36 seconds;   . Ablation     Family History  Problem Relation Age of Onset  . Colon cancer Father 8540    deceased secondary to kidney failure  . Diabetes Father   . Colon cancer Brother [id number]    recent  diagnosis (2012). Finishing chemotherapy.  . Breast cancer Maternal Aunt   . Diabetes Mother   . Diabetes Brother   . Hypertension Brother   . Diabetes Maternal Grandmother    Social History  Substance Use Topics  . Smoking status: Current Every Day Smoker -- 0.50 packs/day for 20 years    Types: Cigarettes  . Smokeless tobacco: Never Used  . Alcohol Use: No   OB History    Gravida Para Term Preterm AB TAB SAB Ectopic Multiple Living   2 2 2       2      Review of Systems  Constitutional: Negative for fever.  Respiratory: Negative for shortness of breath.   Gastrointestinal: Negative for vomiting, abdominal pain and constipation.  Genitourinary: Negative for dysuria, hematuria, flank pain, decreased urine volume and difficulty urinating.  Musculoskeletal: Positive for back pain. Negative for joint swelling.  Skin: Negative for rash.  Neurological: Negative for weakness and numbness.  All other systems reviewed and are negative.     Allergies  Tramadol and Tylenol  Home Medications   Prior to Admission medications   Medication Sig Start Date End Date Taking? Authorizing Provider  lisinopril (PRINIVIL,ZESTRIL) 10 MG tablet Take 10 mg by mouth daily.    Historical Provider, MD  naproxen (NAPROSYN) 375 MG tablet Take 1 tablet (375 mg total) by mouth 2 (two) times daily. 12/09/14   Arthor CaptainAbigail Harris, PA-C  omeprazole (PRILOSEC) 20 MG capsule Take 20 mg by mouth daily.    Historical Provider,  MD   BP 158/130 mmHg  Pulse 90  Temp(Src) 98 F (36.7 C) (Oral)  Resp 16  Ht  (1.6 m)  Wt 63.504 kg  BMI 24.81 kg/m2  SpO2 100% Physical Exam  Constitutional: She is oriented to person, place, and time. She appears well-developed and well-nourished. No distress.  HENT:  Head: Normocephalic and atraumatic.  Neck: Normal range of motion. Neck supple.  Cardiovascular: Normal rate, regular rhythm, normal heart sounds and intact distal pulses.   No murmur heard. Pulmonary/Chest:  Effort normal and breath sounds normal. No respiratory distress.  Abdominal: Soft. She exhibits no distension. There is no tenderness. There is no rebound.  No palpable inguinal hernias.  Musculoskeletal: She exhibits tenderness. She exhibits no edema.       Lumbar back: She exhibits tenderness and pain. She exhibits normal range of motion, no swelling, no deformity, no laceration and normal pulse.  ttp of the lower lumbar spine and left paraspinal muscles.  DP pulses are brisk and symmetrical.  Distal sensation intact.  Hip Flexors/Extensors are intact.  Pt has 5/5 strength against resistance of bilateral lower extremities.     Neurological: She is alert and oriented to person, place, and time. She has normal strength. No sensory deficit. She exhibits normal muscle tone. Coordination and gait normal.  Reflex Scores:      Patellar reflexes are 2+ on the right side and 2+ on the left side.      Achilles reflexes are 2+ on the right side and 2+ on the left side. Skin: Skin is warm and dry. No rash noted.  Nursing note and vitals reviewed.   ED Course  Procedures (including critical care time)   MDM   Final diagnoses:  Sciatica of left side  Essential hypertension    Pt BP is elevated, has hx of HTN. Took her medication this morning.  She denies any symptoms other than back pain. Improved on recheck  Patient ambulates with a steady gait. No focal neuro deficits on exam.  Symptoms are consistent with left-sided sciatica. No concerning symptoms for emergent neurological or infectious process at this time. Patient agrees to symptomatic treatment and close follow-up with her primary care physician. No reported trauma to indicate necessity for imaging at this time.    Pauline Aus, PA-C 12/22/14 1010  Bethann Berkshire, MD 12/22/14 [phone]

## 2014-12-22 NOTE — ED Notes (Signed)
Pt states that she changed tires on the truck yesterday and now is having lower back pain especially on left going down left leg and sharp pain in left groin with walking.

## 2015-02-04 ENCOUNTER — Emergency Department (HOSPITAL_COMMUNITY)
Admission: EM | Admit: 2015-02-04 | Discharge: 2015-02-04 | Disposition: A | Discharge status: Home / Self Care | Payer: PRIVATE HEALTH INSURANCE | Attending: Emergency Medicine | Admitting: Emergency Medicine

## 2015-02-04 ENCOUNTER — Emergency Department (HOSPITAL_COMMUNITY): Payer: PRIVATE HEALTH INSURANCE

## 2015-02-04 ENCOUNTER — Encounter (HOSPITAL_COMMUNITY): Payer: Self-pay | Admitting: *Deleted

## 2015-02-04 DIAGNOSIS — Z862 Personal history of diseases of the blood and blood-forming organs and certain disorders involving the immune mechanism: Secondary | ICD-10-CM | POA: Insufficient documentation

## 2015-02-04 DIAGNOSIS — F17210 Nicotine dependence, cigarettes, uncomplicated: Secondary | ICD-10-CM | POA: Insufficient documentation

## 2015-02-04 DIAGNOSIS — K219 Gastro-esophageal reflux disease without esophagitis: Secondary | ICD-10-CM | POA: Insufficient documentation

## 2015-02-04 DIAGNOSIS — S4991XA Unspecified injury of right shoulder and upper arm, initial encounter: Secondary | ICD-10-CM | POA: Insufficient documentation

## 2015-02-04 DIAGNOSIS — I10 Essential (primary) hypertension: Secondary | ICD-10-CM | POA: Insufficient documentation

## 2015-02-04 DIAGNOSIS — S79912A Unspecified injury of left hip, initial encounter: Secondary | ICD-10-CM | POA: Insufficient documentation

## 2015-02-04 DIAGNOSIS — S79911A Unspecified injury of right hip, initial encounter: Secondary | ICD-10-CM | POA: Insufficient documentation

## 2015-02-04 DIAGNOSIS — Y9289 Other specified places as the place of occurrence of the external cause: Secondary | ICD-10-CM | POA: Insufficient documentation

## 2015-02-04 DIAGNOSIS — J449 Chronic obstructive pulmonary disease, unspecified: Secondary | ICD-10-CM | POA: Insufficient documentation

## 2015-02-04 DIAGNOSIS — Y998 Other external cause status: Secondary | ICD-10-CM | POA: Insufficient documentation

## 2015-02-04 DIAGNOSIS — S43401A Unspecified sprain of right shoulder joint, initial encounter: Secondary | ICD-10-CM | POA: Insufficient documentation

## 2015-02-04 DIAGNOSIS — S3992XA Unspecified injury of lower back, initial encounter: Secondary | ICD-10-CM | POA: Insufficient documentation

## 2015-02-04 DIAGNOSIS — T1490XA Injury, unspecified, initial encounter: Secondary | ICD-10-CM

## 2015-02-04 DIAGNOSIS — W000XXA Fall on same level due to ice and snow, initial encounter: Secondary | ICD-10-CM | POA: Insufficient documentation

## 2015-02-04 DIAGNOSIS — Z79899 Other long term (current) drug therapy: Secondary | ICD-10-CM | POA: Insufficient documentation

## 2015-02-04 DIAGNOSIS — Y9389 Activity, other specified: Secondary | ICD-10-CM | POA: Insufficient documentation

## 2015-02-04 MED ORDER — KETOROLAC TROMETHAMINE 30 MG/ML IJ SOLN
30.0000 mg | Freq: Once | INTRAMUSCULAR | Status: AC
  Administered 2015-02-04: 30 mg via INTRAMUSCULAR
  Filled 2015-02-04: qty 1

## 2015-02-04 MED ORDER — HYDROCODONE-ACETAMINOPHEN 5-325 MG PO TABS: ORAL_TABLET | ORAL | Status: DC

## 2015-02-04 MED ORDER — CYCLOBENZAPRINE HCL 10 MG PO TABS: 10.0000 mg | ORAL_TABLET | Freq: Three times a day (TID) | ORAL | Status: DC | PRN

## 2015-02-04 NOTE — Discharge Instructions (Signed)
You were seen in the emergency department for your back pain and shoulder pain after fall. At this time your x-rays do not show any fracture or acute injury. Likely that pain you're feeling is secondary to muscle injury. There is a small chance that a small fracture can be missed on initial x-rays. Please follow-up outpatient with your primary care physician or with the clinic whose information is provided for reevaluation and any further imaging that may be needed.  Back Pain, Adult Back pain is very common in adults.The cause of back pain is rarely dangerous and the pain often gets better over time.The cause of your back pain may not be known. Some common causes of back pain include:  Strain of the muscles or ligaments supporting the spine.  Wear and tear (degeneration) of the spinal disks.  Arthritis.  Direct injury to the back. For many people, back pain may return. Since back pain is rarely dangerous, most people can learn to manage this condition on their own. HOME CARE INSTRUCTIONS Watch your back pain for any changes. The following actions may help to lessen any discomfort you are feeling:  Remain active. It is stressful on your back to sit or stand in one place for long periods of time. Do not sit, drive, or stand in one place for more than 30 minutes at a time. Take short walks on even surfaces as soon as you are able.Try to increase the length of time you walk each day.  Exercise regularly as directed by your health care provider. Exercise helps your back heal faster. It also helps avoid future injury by keeping your muscles strong and flexible.  Do not stay in bed.Resting more than 1-2 days can delay your recovery.  Pay attention to your body when you bend and lift. The most comfortable positions are those that put less stress on your recovering back. Always use proper lifting techniques, including:  Bending your knees.  Keeping the load close to your body.  Avoiding  twisting.  Find a comfortable position to sleep. Use a firm mattress and lie on your side with your knees slightly bent. If you lie on your back, put a pillow under your knees.  Avoid feeling anxious or stressed.Stress increases muscle tension and can worsen back pain.It is important to recognize when you are anxious or stressed and learn ways to manage it, such as with exercise.  Take medicines only as directed by your health care provider. Over-the-counter medicines to reduce pain and inflammation are often the most helpful.Your health care provider may prescribe muscle relaxant drugs.These medicines help dull your pain so you can more quickly return to your normal activities and healthy exercise.  Apply ice to the injured area:  Put ice in a plastic bag.  Place a towel between your skin and the bag.  Leave the ice on for 20 minutes, 2-3 times a day for the first 2-3 days. After that, ice and heat may be alternated to reduce pain and spasms.  Maintain a healthy weight. Excess weight puts extra stress on your back and makes it difficult to maintain good posture. SEEK MEDICAL CARE IF:  You have pain that is not relieved with rest or medicine.  You have increasing pain going down into the legs or buttocks.  You have pain that does not improve in one week.  You have night pain.  You lose weight.  You have a fever or chills. SEEK IMMEDIATE MEDICAL CARE IF:   You develop  new bowel or bladder control problems.  You have unusual weakness or numbness in your arms or legs.  You develop nausea or vomiting.  You develop abdominal pain.  You feel faint.   This information is not intended to replace advice given to you by your health care provider. Make sure you discuss any questions you have with your health care provider.   Document Released: 01/13/2005 Document Revised: 02/03/2014 Document Reviewed: 05/17/2013 Elsevier Interactive Patient Education 2016 Elsevier  Inc.  Shoulder Sprain A shoulder sprain is a partial or complete tear in one of the tough, fiber-like tissues (ligaments) in the shoulder. The ligaments in the shoulder help to hold the shoulder in place. CAUSES This condition may be caused by:  A fall.  A hit to the shoulder.  A twist of the arm. RISK FACTORS This condition is more likely to develop in:  People who play sports.  People who have problems with balance or coordination. SYMPTOMS Symptoms of this condition include:  Pain when moving the shoulder.  Limited ability to move the shoulder.  Swelling and tenderness on top of the shoulder.  Warmth in the shoulder.  A change in the shape of the shoulder.  Redness or bruising on the shoulder. DIAGNOSIS This condition is diagnosed with a physical exam. During the exam, you may be asked to do simple exercises with your shoulder. You may also have imaging tests, such as X-rays, MRI, or a CT scan. These tests can show how severe the sprain is. TREATMENT This condition may be treated with:  Rest.  Pain medicine.  Ice.  A sling or brace. This is used to keep the arm still while the shoulder is healing.  Physical therapy or rehabilitation exercises. These help to improve the range of motion and strength of the shoulder.  Surgery (rare). Surgery may be needed if the sprain caused a joint to become unstable. Surgery may also be needed to reduce pain. Some people may develop ongoing shoulder pain or lose some range of motion in the shoulder. However, most people do not develop long-term problems. HOME CARE INSTRUCTIONS  Rest.  Take over-the-counter and prescription medicines only as told by your health care provider.  If directed, apply ice to the area:  Put ice in a plastic bag.  Place a towel between your skin and the bag.  Leave the ice on for 20 minutes, 2-3 times per day.  If you were given a shoulder sling or brace:  Wear it as told.  Remove it to  shower or bathe.  Move your arm only as much as told by your health care provider, but keep your hand moving to prevent swelling.  If you were shown how to do any exercises, do them as told by your health care provider.  Keep all follow-up visits as told by your health care provider. This is important. SEEK MEDICAL CARE IF:  Your pain gets worse.  Your pain is not relieved with medicines.  You have increased redness or swelling. SEEK IMMEDIATE MEDICAL CARE IF:  You have a fever.  You cannot move your arm or shoulder.  You develop numbness or tingling in your arms, hands, or fingers.   This information is not intended to replace advice given to you by your health care provider. Make sure you discuss any questions you have with your health care provider.   Document Released: 06/01/2008 Document Revised: 10/04/2014 Document Reviewed: 05/08/2014 Elsevier Interactive Patient Education [employer]2016 Elsevier Inc.

## 2015-02-04 NOTE — ED Notes (Signed)
MD at bedside. 

## 2015-02-04 NOTE — ED Provider Notes (Signed)
CSN: [id number]     Arrival date & time 02/04/15  [id number] History   First MD Initiated Contact with Patient 02/04/15 [phone]     Chief Complaint  Patient presents with  . Back Pain  . Shoulder Pain     (Consider location/radiation/quality/duration/timing/severity/associated sxs/prior Treatment) HPI Comments: 50 year old female with history of COPD, hypertension, GERD presents following a fall. The patient reports that she slipped on ice about an hour prior to presentation. She reports she came down on her tailbone and has been having pain in her lower back and bilateral hips ever since. She said that when she fell she tried to catch herself with her right arm and has been having right shoulder pain since that time. She denies any injury to her shoulder or back previously. No surgeries. She has not taken anything for the pain. She was able to drive herself to the hospital. Denies hitting her head or loss of consciousness.  Patient is a 50 y.o. female presenting with back pain and shoulder pain.  Back Pain Associated symptoms: no dysuria, no headaches, no numbness and no weakness   Shoulder Pain Associated symptoms: back pain   Associated symptoms: no neck pain     Past Medical History  Diagnosis Date  . COPD (chronic obstructive pulmonary disease) (HCC)   . Anemia   . Pleurisy   . GERD (gastroesophageal reflux disease)   . Hypertension    Past Surgical History  Procedure Laterality Date  . Tubal ligation    . Cyst removed from right face    . Esophagogastroduodenoscopy  11/07/2010    Procedure: ESOPHAGOGASTRODUODENOSCOPY (EGD);  Surgeon: Corbin Adeobert M Rourk, MD;  Location: [facility];  Service: Endoscopy;  Laterality: N/A;  . Dilitation & currettage/hystroscopy with thermachoice ablation N/A 11/17/2012    Procedure: DILATATION & CURETTAGE/HYSTEROSCOPY WITH THERMACHOICE ABLATION;  Surgeon: Lazaro ArmsLuther H Eure, MD;  Location: [facility];  Service: Gynecology;  Laterality: N/A;  Total D5 in 21ml, total  D5 out 21ml; temp 86-87 degrees Celcius; total time 9 minutes and 36 seconds;   . Ablation     Family History  Problem Relation Age of Onset  . Colon cancer Father 8440    deceased secondary to kidney failure  . Diabetes Father   . Colon cancer Brother [id number]    recent diagnosis (2012). Finishing chemotherapy.  . Breast cancer Maternal Aunt   . Diabetes Mother   . Diabetes Brother   . Hypertension Brother   . Diabetes Maternal Grandmother    Social History  Substance Use Topics  . Smoking status: Current Every Day Smoker -- 0.50 packs/day for 20 years    Types: Cigarettes  . Smokeless tobacco: Never Used  . Alcohol Use: No   OB History    Gravida Para Term Preterm AB TAB SAB Ectopic Multiple Living   2 2 2       2      Review of Systems  Gastrointestinal: Negative for nausea and vomiting.  Genitourinary: Negative for dysuria and hematuria.  Musculoskeletal: Positive for back pain and arthralgias (right shoulder). Negative for myalgias, neck pain and neck stiffness.  Neurological: Negative for dizziness, syncope, weakness, numbness and headaches.  Hematological: Does not bruise/bleed easily.      Allergies  Tramadol and Tylenol  Home Medications   Prior to Admission medications   Medication Sig Start Date End Date Taking? Authorizing Provider  Aspirin-Caffeine (BC FAST PAIN RELIEF PO) Take 1 packet by mouth daily as needed (pain).   Yes  Historical Provider, MD  lisinopril (PRINIVIL,ZESTRIL) 10 MG tablet Take 10 mg by mouth daily.   Yes Historical Provider, MD  omeprazole (PRILOSEC OTC) 20 MG tablet Take 20 mg by mouth daily.   Yes Historical Provider, MD  cyclobenzaprine (FLEXERIL) 10 MG tablet Take 1 tablet (10 mg total) by mouth 3 (three) times daily as needed for muscle spasms. 02/04/15   Leta Baptist, MD  HYDROcodone-acetaminophen (NORCO/VICODIN) 5-325 MG tablet Take one-two tabs po q 4-6 hrs prn pain 02/04/15   Leta Baptist, MD  naproxen (NAPROSYN) 375 MG tablet  Take 1 tablet (375 mg total) by mouth 2 (two) times daily. Patient not taking: Reported on 02/04/2015 12/09/14   Arthor Captain, PA-C   BP 175/110 mmHg  Pulse 72  Temp(Src) 98 F (36.7 C) (Oral)  Resp 16  Ht 5\' 3"  (1.6 m)  Wt 140 lb (63.504 kg)  BMI 24.81 kg/m2  SpO2 100% Physical Exam  Constitutional: She is oriented to person, place, and time. She appears well-developed and well-nourished. No distress.  HENT:  Head: Normocephalic and atraumatic.  Right Ear: External ear normal.  Left Ear: External ear normal.  Nose: Nose normal.  Mouth/Throat: Oropharynx is clear and moist. No oropharyngeal exudate.  Eyes: EOM are normal. Pupils are equal, round, and reactive to light.  Neck: Normal range of motion. Neck supple.  Cardiovascular: Normal rate, regular rhythm, normal heart sounds and intact distal pulses.   No murmur heard. Pulmonary/Chest: Effort normal. No respiratory distress. She has no wheezes. She has no rales.  Abdominal: Soft. She exhibits no distension. There is no tenderness.  Musculoskeletal: Normal range of motion. She exhibits no edema.       Right shoulder: She exhibits tenderness (over the anterior shoulder and distal clavicle) and pain. She exhibits normal range of motion, no deformity, no laceration, normal pulse and normal strength.       Right elbow: Normal.      Right hip: She exhibits tenderness. She exhibits normal range of motion, normal strength, no swelling, no crepitus and no deformity.       Left hip: She exhibits tenderness. She exhibits normal range of motion, normal strength, no swelling and no crepitus.       Cervical back: Normal.       Thoracic back: Normal.       Lumbar back: She exhibits tenderness, pain and spasm. She exhibits normal range of motion, no swelling, no edema, no deformity and normal pulse.  Neurological: She is alert and oriented to person, place, and time. She has normal strength. No sensory deficit. She exhibits normal muscle tone.   Patient able to range lower extremities without difficulty. During evaluation actively bending legs and taking off her boots. No saddle anesthesia.  Skin: Skin is warm and dry. No rash noted. She is not diaphoretic.  Vitals reviewed.   ED Course  Procedures (including critical care time) Labs Review Labs Reviewed - No data to display  Imaging Review Dg Lumbar Spine Complete  02/04/2015  CLINICAL DATA:  Slipped and fell on ice this morning, injury to back. Low back pain. EXAM: LUMBAR SPINE - COMPLETE 4+ VIEW COMPARISON:  None. FINDINGS: Evidence of mild degenerative disc disease is noted throughout the lumbar spine with associated mild osseous spurring. No evidence of advanced degenerative disc disease at any level. Additional mild degenerative hypertrophy noted amongst the posterior facets from the L3 through S1 levels. Osseous alignment is normal. No fracture line or displaced fracture fragment identified.  Bone mineralization is normal. Upper sacrum appears intact and well aligned. Paravertebral soft tissues are unremarkable. IMPRESSION: Mild degenerative change. No acute findings.  No osseous fracture or dislocation. Electronically Signed   By: Bary Richard M.D.   On: 02/04/2015 11:18   Dg Shoulder Right  02/04/2015  CLINICAL DATA:  Patient status post fall in the eyes. Initial encounter. Right shoulder. EXAM: RIGHT SHOULDER - 2+ VIEW COMPARISON:  None. FINDINGS: There is no evidence of fracture or dislocation. There is no evidence of arthropathy or other focal bone abnormality. Soft tissues are unremarkable. IMPRESSION: Negative. Electronically Signed   By: Annia Belt M.D.   On: 02/04/2015 11:19   Dg Hips Bilat With Pelvis 2v  02/04/2015  CLINICAL DATA:  Slipped on ice EXAM: DG HIP (WITH OR WITHOUT PELVIS) 2V BILAT COMPARISON:  None. FINDINGS: There is no evidence of hip fracture or dislocation. There is no evidence of arthropathy or other focal bone abnormality. IMPRESSION: Negative.  Electronically Signed   By: Signa Kell M.D.   On: 02/04/2015 11:21   I have personally reviewed and evaluated these images and lab results as part of my medical decision-making.   EKG Interpretation None      MDM  Patient was seen and evaluated in stable condition. Patient neurovascularly intact. Ranging all extremities without difficulty. No decreased range of motion of the back. X-rays were negative for acute process. Patient was able to ambulate without difficulty. She was discharged home in stable condition with prescriptions for Norco and Valium. She was instructed to follow-up outpatient with the primary care physician. Final diagnoses:  Trauma  Back injury, initial encounter    1. Back injury  2. Shoulder sprain    Leta Baptist, MD 02/04/15 [phone]

## 2015-02-04 NOTE — ED Notes (Signed)
Pt states she was on her deck and fell on her back. Pt has lower back pain rated at 10/10. Pt denies hitting her head or any loss of consciousness. When pt was trying to get in the vehicle she states she may have injured her right shoulder. Pt is using her right shoulder upon triage. NAD noted.

## 2015-03-30 ENCOUNTER — Emergency Department (HOSPITAL_COMMUNITY)
Admission: EM | Admit: 2015-03-30 | Discharge: 2015-03-30 | Disposition: A | Discharge status: Home / Self Care | Payer: PRIVATE HEALTH INSURANCE | Attending: Emergency Medicine | Admitting: Emergency Medicine

## 2015-03-30 ENCOUNTER — Encounter (HOSPITAL_COMMUNITY): Payer: Self-pay | Admitting: Emergency Medicine

## 2015-03-30 DIAGNOSIS — M5441 Lumbago with sciatica, right side: Secondary | ICD-10-CM | POA: Insufficient documentation

## 2015-03-30 DIAGNOSIS — J449 Chronic obstructive pulmonary disease, unspecified: Secondary | ICD-10-CM | POA: Insufficient documentation

## 2015-03-30 DIAGNOSIS — I10 Essential (primary) hypertension: Secondary | ICD-10-CM | POA: Insufficient documentation

## 2015-03-30 DIAGNOSIS — Z9851 Tubal ligation status: Secondary | ICD-10-CM | POA: Insufficient documentation

## 2015-03-30 DIAGNOSIS — F17210 Nicotine dependence, cigarettes, uncomplicated: Secondary | ICD-10-CM | POA: Insufficient documentation

## 2015-03-30 DIAGNOSIS — M5431 Sciatica, right side: Secondary | ICD-10-CM

## 2015-03-30 DIAGNOSIS — Z79899 Other long term (current) drug therapy: Secondary | ICD-10-CM | POA: Insufficient documentation

## 2015-03-30 DIAGNOSIS — Z791 Long term (current) use of non-steroidal anti-inflammatories (NSAID): Secondary | ICD-10-CM | POA: Insufficient documentation

## 2015-03-30 HISTORY — DX: Sciatica, unspecified side: M54.30

## 2015-03-30 MED ORDER — HYDROCODONE-ACETAMINOPHEN 5-325 MG PO TABS: 2.0000 | ORAL_TABLET | ORAL | Status: DC | PRN

## 2015-03-30 MED ORDER — CYCLOBENZAPRINE HCL 10 MG PO TABS: 10.0000 mg | ORAL_TABLET | Freq: Two times a day (BID) | ORAL | Status: DC | PRN

## 2015-03-30 NOTE — ED Notes (Addendum)
Pt reports left lower back pain radiating down LT leg. Pt reports moving furniture yesterday. Pt hx of sciatica. Denies urinary symptoms or stool incontinence. Pt ambulatory. Pt hypertensive 170/120 (repeated x3). Pt states she took BP medication at 0600 this morning.

## 2015-03-30 NOTE — Discharge Instructions (Signed)

## 2015-03-30 NOTE — ED Provider Notes (Signed)
CSN: [id number]     Arrival date & time 03/30/15  0749 History   First MD Initiated Contact with Patient 03/30/15 [phone]     Chief Complaint  Patient presents with  . Back Pain     (Consider location/radiation/quality/duration/timing/severity/associated sxs/prior Treatment) Patient is a 50 y.o. female presenting with back pain. The history is provided by the patient. No language interpreter was used.  Back Pain Location:  Generalized Quality:  Aching Radiates to:  Does not radiate Pain severity:  Moderate Pain is:  Same all the time Onset quality:  Gradual Duration:  1 week Timing:  Constant Progression:  Worsening Chronicity:  New Context: lifting heavy objects   Relieved by:  Nothing Worsened by:  Nothing tried Ineffective treatments:  None tried Associated symptoms: no tingling   Risk factors: no hx of osteoporosis   Pt reports she has had sciatica in the past   Past Medical History  Diagnosis Date  . COPD (chronic obstructive pulmonary disease) (HCC)   . Anemia   . Pleurisy   . GERD (gastroesophageal reflux disease)   . Hypertension   . Sciatica    Past Surgical History  Procedure Laterality Date  . Tubal ligation    . Cyst removed from right face    . Esophagogastroduodenoscopy  11/07/2010    Procedure: ESOPHAGOGASTRODUODENOSCOPY (EGD);  Surgeon: Corbin Adeobert M Rourk, MD;  Location: [facility];  Service: Endoscopy;  Laterality: N/A;  . Dilitation & currettage/hystroscopy with thermachoice ablation N/A 11/17/2012    Procedure: DILATATION & CURETTAGE/HYSTEROSCOPY WITH THERMACHOICE ABLATION;  Surgeon: Lazaro ArmsLuther H Eure, MD;  Location: [facility];  Service: Gynecology;  Laterality: N/A;  Total D5 in 21ml, total D5 out 21ml; temp 86-87 degrees Celcius; total time 9 minutes and 36 seconds;   . Ablation     Family History  Problem Relation Age of Onset  . Colon cancer Father 2740    deceased secondary to kidney failure  . Diabetes Father   . Colon cancer Brother 3842    recent  diagnosis (2012). Finishing chemotherapy.  . Breast cancer Maternal Aunt   . Diabetes Mother   . Diabetes Brother   . Hypertension Brother   . Diabetes Maternal Grandmother    Social History  Substance Use Topics  . Smoking status: Current Every Day Smoker -- 0.50 packs/day for 20 years    Types: Cigarettes  . Smokeless tobacco: Never Used  . Alcohol Use: No   OB History    Gravida Para Term Preterm AB TAB SAB Ectopic Multiple Living   2 2 2       2      Review of Systems  Musculoskeletal: Positive for back pain.  Neurological: Negative for tingling.  All other systems reviewed and are negative.     Allergies  Tramadol and Tylenol  Home Medications   Prior to Admission medications   Medication Sig Start Date End Date Taking? Authorizing Provider  Aspirin-Caffeine (BC FAST PAIN RELIEF PO) Take 1 packet by mouth daily as needed (pain).    Historical Provider, MD  cyclobenzaprine (FLEXERIL) 10 MG tablet Take 1 tablet (10 mg total) by mouth 2 (two) times daily as needed for muscle spasms. 03/30/15   Elson AreasLeslie K [place] Graefe, PA-C  HYDROcodone-acetaminophen (NORCO/VICODIN) 5-325 MG tablet Take 2 tablets by mouth every 4 (four) hours as needed. 03/30/15   Elson AreasLeslie K Ashby Leflore, PA-C  lisinopril (PRINIVIL,ZESTRIL) 10 MG tablet Take 10 mg by mouth daily.    Historical Provider, MD  naproxen (NAPROSYN) 375  MG tablet Take 1 tablet (375 mg total) by mouth 2 (two) times daily. Patient not taking: Reported on 02/04/2015 12/09/14   Arthor Captain, PA-C  omeprazole (PRILOSEC OTC) 20 MG tablet Take 20 mg by mouth daily.    Historical Provider, MD   BP 153/105 mmHg  Pulse 66  Temp(Src) 97.8 F (36.6 C) (Oral)  Resp 16  Ht  (1.6 m)  Wt 65.772 kg  BMI 25.69 kg/m2  SpO2 96% Physical Exam  Constitutional: She is oriented to person, place, and time. She appears well-developed and well-nourished.  HENT:  Head: Normocephalic.  Eyes: EOM are normal.  Neck: Normal range of motion.  Pulmonary/Chest:  Effort normal.  Abdominal: She exhibits no distension.  Musculoskeletal: She exhibits tenderness.  Tender left sciatic notch. nv and ns intact  Neurological: She is alert and oriented to person, place, and time.  Psychiatric: She has a normal mood and affect.  Nursing note and vitals reviewed.   ED Course  Procedures (including critical care time) Labs Review Labs Reviewed - No data to display  Imaging Review No results found. I have personally reviewed and evaluated these images and lab results as part of my medical decision-making.   EKG Interpretation None      MDM   Final diagnoses:  Sciatica, right    Meds ordered this encounter  Medications  . HYDROcodone-acetaminophen (NORCO/VICODIN) 5-325 MG tablet    Sig: Take 2 tablets by mouth every 4 (four) hours as needed.    Dispense:  10 tablet    Refill:  0    Order Specific Question:  Supervising Provider    Answer:  Hyacinth Meeker, BRIAN [[id number]]  . cyclobenzaprine (FLEXERIL) 10 MG tablet    Sig: Take 1 tablet (10 mg total) by mouth 2 (two) times daily as needed for muscle spasms.    Dispense:  20 tablet    Refill:  0    Order Specific Question:  Supervising Provider    Answer:  Eber Hong [[id number]]  An After Visit Summary was printed and given to the patient.    Elson Areas, PA-C 03/30/15 [address] [place], [place] 03/30/15 1331  Bethann Berkshire, MD 03/30/15 1335

## 2015-05-25 ENCOUNTER — Encounter (HOSPITAL_COMMUNITY): Payer: Self-pay

## 2015-05-25 ENCOUNTER — Emergency Department (HOSPITAL_COMMUNITY)
Admission: EM | Admit: 2015-05-25 | Discharge: 2015-05-25 | Disposition: A | Discharge status: Home / Self Care | Payer: Self-pay | Attending: Emergency Medicine | Admitting: Emergency Medicine

## 2015-05-25 ENCOUNTER — Emergency Department (HOSPITAL_COMMUNITY): Payer: Self-pay

## 2015-05-25 DIAGNOSIS — I10 Essential (primary) hypertension: Secondary | ICD-10-CM | POA: Insufficient documentation

## 2015-05-25 DIAGNOSIS — M19011 Primary osteoarthritis, right shoulder: Secondary | ICD-10-CM | POA: Insufficient documentation

## 2015-05-25 DIAGNOSIS — M549 Dorsalgia, unspecified: Secondary | ICD-10-CM | POA: Insufficient documentation

## 2015-05-25 DIAGNOSIS — Y929 Unspecified place or not applicable: Secondary | ICD-10-CM | POA: Insufficient documentation

## 2015-05-25 DIAGNOSIS — X500XXA Overexertion from strenuous movement or load, initial encounter: Secondary | ICD-10-CM | POA: Insufficient documentation

## 2015-05-25 DIAGNOSIS — Y9389 Activity, other specified: Secondary | ICD-10-CM | POA: Insufficient documentation

## 2015-05-25 DIAGNOSIS — S46911A Strain of unspecified muscle, fascia and tendon at shoulder and upper arm level, right arm, initial encounter: Secondary | ICD-10-CM | POA: Insufficient documentation

## 2015-05-25 DIAGNOSIS — F17210 Nicotine dependence, cigarettes, uncomplicated: Secondary | ICD-10-CM | POA: Insufficient documentation

## 2015-05-25 DIAGNOSIS — J449 Chronic obstructive pulmonary disease, unspecified: Secondary | ICD-10-CM | POA: Insufficient documentation

## 2015-05-25 DIAGNOSIS — Y999 Unspecified external cause status: Secondary | ICD-10-CM | POA: Insufficient documentation

## 2015-05-25 MED ORDER — DIAZEPAM 5 MG PO TABS
10.0000 mg | ORAL_TABLET | Freq: Once | ORAL | Status: AC
  Administered 2015-05-25: 10 mg via ORAL
  Filled 2015-05-25: qty 2

## 2015-05-25 MED ORDER — DEXAMETHASONE 4 MG PO TABS: 4.0000 mg | ORAL_TABLET | Freq: Two times a day (BID) | ORAL | Status: DC

## 2015-05-25 MED ORDER — DEXAMETHASONE SODIUM PHOSPHATE 4 MG/ML IJ SOLN
8.0000 mg | Freq: Once | INTRAMUSCULAR | Status: AC
  Administered 2015-05-25: 8 mg via INTRAMUSCULAR
  Filled 2015-05-25: qty 2

## 2015-05-25 MED ORDER — MELOXICAM 15 MG PO TABS: 15.0000 mg | ORAL_TABLET | Freq: Every day | ORAL | Status: DC

## 2015-05-25 MED ORDER — METHOCARBAMOL 500 MG PO TABS: 500.0000 mg | ORAL_TABLET | Freq: Three times a day (TID) | ORAL | Status: DC

## 2015-05-25 MED ORDER — KETOROLAC TROMETHAMINE 10 MG PO TABS
10.0000 mg | ORAL_TABLET | Freq: Once | ORAL | Status: AC
  Administered 2015-05-25: 10 mg via ORAL
  Filled 2015-05-25: qty 1

## 2015-05-25 NOTE — ED Notes (Signed)
Pt reports woke up this morning with pain in r shoulder.  Pt says she moved a washing machine yesterday.

## 2015-05-25 NOTE — ED Provider Notes (Signed)
CSN: [id number]     Arrival date & time 05/25/15  1311 History   First MD Initiated Contact with Patient 05/25/15 1325     Chief Complaint  Patient presents with  . Shoulder Pain     (Consider location/radiation/quality/duration/timing/severity/associated sxs/prior Treatment) Patient is a 50 y.o. female presenting with shoulder pain. The history is provided by the patient.  Shoulder Pain Location:  Shoulder Shoulder location:  R shoulder Pain details:    Quality:  Aching and shooting   Severity:  Moderate   Onset quality:  Gradual   Duration:  1 day   Timing:  Intermittent   Progression:  Worsening Chronicity:  New Handedness:  Right-handed Prior injury to area:  No Relieved by:  Nothing Worsened by:  Movement Ineffective treatments:  None tried Associated symptoms: back pain and decreased range of motion   Associated symptoms: no fever and no neck pain     Past Medical History  Diagnosis Date  . COPD (chronic obstructive pulmonary disease) (HCC)   . Anemia   . Pleurisy   . GERD (gastroesophageal reflux disease)   . Hypertension   . Sciatica    Past Surgical History  Procedure Laterality Date  . Tubal ligation    . Cyst removed from right face    . Esophagogastroduodenoscopy  11/07/2010    Procedure: ESOPHAGOGASTRODUODENOSCOPY (EGD);  Surgeon: Corbin Ade, MD;  Location: [facility];  Service: Endoscopy;  Laterality: N/A;  . Dilitation & currettage/hystroscopy with thermachoice ablation N/A 11/17/2012    Procedure: DILATATION & CURETTAGE/HYSTEROSCOPY WITH THERMACHOICE ABLATION;  Surgeon: Lazaro Arms, MD;  Location: [facility];  Service: Gynecology;  Laterality: N/A;  Total D5 in 21ml, total D5 out 21ml; temp 86-87 degrees Celcius; total time 9 minutes and 36 seconds;   . Ablation     Family History  Problem Relation Age of Onset  . Colon cancer Father 21    deceased secondary to kidney failure  . Diabetes Father   . Colon cancer Brother 32    recent  diagnosis (2012). Finishing chemotherapy.  . Breast cancer Maternal Aunt   . Diabetes Mother   . Diabetes Brother   . Hypertension Brother   . Diabetes Maternal Grandmother    Social History  Substance Use Topics  . Smoking status: Current Every Day Smoker -- 0.50 packs/day for 20 years    Types: Cigarettes  . Smokeless tobacco: Never Used  . Alcohol Use: No   OB History    Gravida Para Term Preterm AB TAB SAB Ectopic Multiple Living   Review of Systems  Constitutional: Negative for fever and activity change.       All ROS Neg except as noted in HPI  HENT: Negative for nosebleeds.   Eyes: Negative for photophobia and discharge.  Respiratory: Negative for cough, shortness of breath and wheezing.   Cardiovascular: Negative for chest pain and palpitations.  Gastrointestinal: Negative for abdominal pain and blood in stool.  Genitourinary: Negative for dysuria, frequency and hematuria.  Musculoskeletal: Positive for back pain and arthralgias. Negative for neck pain.  Skin: Negative.   Neurological: Negative for dizziness, seizures and speech difficulty.  Psychiatric/Behavioral: Negative for hallucinations and confusion.      Allergies  Tramadol and Tylenol  Home Medications   Prior to Admission medications   Medication Sig Start Date End Date Taking? Authorizing Provider  Aspirin-Caffeine (BC FAST PAIN RELIEF PO) Take 1  packet by mouth daily as needed (pain).    Historical Provider, MD  cyclobenzaprine (FLEXERIL) 10 MG tablet Take 1 tablet (10 mg total) by mouth 2 (two) times daily as needed for muscle spasms. 03/30/15   Elson AreasLeslie K Sofia, PA-C  HYDROcodone-acetaminophen (NORCO/VICODIN) 5-325 MG tablet Take 2 tablets by mouth every 4 (four) hours as needed. 03/30/15   Elson AreasLeslie K Sofia, PA-C  lisinopril (PRINIVIL,ZESTRIL) 10 MG tablet Take 10 mg by mouth daily.    Historical Provider, MD  naproxen (NAPROSYN) 375 MG tablet Take 1 tablet (375 mg total) by mouth 2 (two)  times daily. Patient not taking: Reported on 02/04/2015 12/09/14   Arthor CaptainAbigail Harris, PA-C  omeprazole (PRILOSEC OTC) 20 MG tablet Take 20 mg by mouth daily.    Historical Provider, MD   BP 171/90 mmHg  Pulse 90  Temp(Src) 97.8 F (36.6 C) (Temporal)  Resp 18  Ht 5\' 3"  (1.6 m)  Wt 65.772 kg  BMI 25.69 kg/m2  SpO2 100% Physical Exam  Constitutional: She is oriented to person, place, and time. She appears well-developed and well-nourished.  Non-toxic appearance.  HENT:  Head: Normocephalic.  Right Ear: Tympanic membrane and external ear normal.  Left Ear: Tympanic membrane and external ear normal.  Eyes: EOM and lids are normal. Pupils are equal, round, and reactive to light.  Neck: Normal range of motion. Neck supple. Carotid bruit is not present.  Cardiovascular: Normal rate, regular rhythm, normal heart sounds, intact distal pulses and normal pulses.   Pulmonary/Chest: Breath sounds normal. No respiratory distress.  Abdominal: Soft. Bowel sounds are normal. There is no tenderness. There is no guarding.  Musculoskeletal:       Right shoulder: She exhibits decreased range of motion, tenderness and pain. She exhibits no swelling and no deformity.  Lymphadenopathy:       Head (right side): No submandibular adenopathy present.       Head (left side): No submandibular adenopathy present.    She has no cervical adenopathy.  Neurological: She is alert and oriented to person, place, and time. She has normal strength. No cranial nerve deficit or sensory deficit.  Skin: Skin is warm and dry.  Psychiatric: She has a normal mood and affect. Her speech is normal.  Nursing note and vitals reviewed.   ED Course  Procedures (including critical care time) Labs Review Labs Reviewed - No data to display  Imaging Review Dg Shoulder Right  05/25/2015  CLINICAL DATA:  Right shoulder pain. No known injury. Moved a washing machine yesterday. Pain started this morning. EXAM: RIGHT SHOULDER - 2+ VIEW  COMPARISON:  02/04/2015 FINDINGS: Degenerative changes in the right [facility]. Glenohumeral joint is maintained. No acute bony abnormality. Specifically, no fracture, subluxation, or dislocation. Soft tissues are intact. IMPRESSION: Mild to moderate degenerative changes in the right AC joint. No acute findings. Electronically Signed   By: Charlett NoseKevin  Dover M.D.   On: 05/25/2015 13:42   I have personally reviewed and evaluated these images and lab results as part of my medical decision-making.   EKG Interpretation None      MDM  B/P 171/90, otherwise vital signs stable. Exam favors shoulder strain (rt). No evidence for fx or dislocation. No gross neurovascular problem. Pt fitted with sling. She will be treated with robaxin, mobic, and decadron. Pt referred to orthopedics for additional management if not improving.   Final diagnoses:  Shoulder strain, right, initial encounter  Primary osteoarthritis of right shoulder    *I have reviewed nursing notes,  vital signs, and all appropriate lab and imaging results for this patient.**    Ivery Quale, PA-C 05/25/15 2221  Bethann Berkshire, MD 05/26/15 [phone]

## 2015-05-25 NOTE — Discharge Instructions (Signed)
Your examination suggested strain of your right shoulder. Your x-ray is negative for fracture or dislocation, but does show arthritis present in the shoulder. Please use the sling over the next 4 or 5 days. Use medications as prescribed. Robaxin may cause drowsiness, please use this medication with caution. Please see the orthopedic specialist for additional evaluation and orthopedic management. Osteoarthritis Osteoarthritis is a disease that causes soreness and inflammation of a joint. It occurs when the cartilage at the affected joint wears down. Cartilage acts as a cushion, covering the ends of bones where they meet to form a joint. Osteoarthritis is the most common form of arthritis. It often occurs in older people. The joints affected most often by this condition include those in the:  Ends of the fingers.  Thumbs.  Neck.  Lower back.  Knees.  Hips. CAUSES  Over time, the cartilage that covers the ends of bones begins to wear away. This causes bone to rub on bone, producing pain and stiffness in the affected joints.  RISK FACTORS Certain factors can increase your chances of having osteoarthritis, including:  Older age.  Excessive body weight.  Overuse of joints.  Previous joint injury. SIGNS AND SYMPTOMS   Pain, swelling, and stiffness in the joint.  Over time, the joint may lose its normal shape.  Small deposits of bone (osteophytes) may grow on the edges of the joint.  Bits of bone or cartilage can break off and float inside the joint space. This may cause more pain and damage. DIAGNOSIS  Your health care provider will do a physical exam and ask about your symptoms. Various tests may be ordered, such as:  X-rays of the affected joint.  Blood tests to rule out other types of arthritis. Additional tests may be used to diagnose your condition. TREATMENT  Goals of treatment are to control pain and improve joint function. Treatment plans may include:  A prescribed  exercise program that allows for rest and joint relief.  A weight control plan.  Pain relief techniques, such as:  Properly applied heat and cold.  Electric pulses delivered to nerve endings under the skin (transcutaneous electrical nerve stimulation [TENS]).  Massage.  Certain nutritional supplements.  Medicines to control pain, such as:  Acetaminophen.  Nonsteroidal anti-inflammatory drugs (NSAIDs), such as naproxen.  Narcotic or central-acting agents, such as tramadol.  Corticosteroids. These can be given orally or as an injection.  Surgery to reposition the bones and relieve pain (osteotomy) or to remove loose pieces of bone and cartilage. Joint replacement may be needed in advanced states of osteoarthritis. HOME CARE INSTRUCTIONS   Take medicines only as directed by your health care provider.  Maintain a healthy weight. Follow your health care provider's instructions for weight control. This may include dietary instructions.  Exercise as directed. Your health care provider can recommend specific types of exercise. These may include:  Strengthening exercises. These are done to strengthen the muscles that support joints affected by arthritis. They can be performed with weights or with exercise bands to add resistance.  Aerobic activities. These are exercises, such as brisk walking or low-impact aerobics, that get your heart pumping.  Range-of-motion activities. These keep your joints limber.  Balance and agility exercises. These help you maintain daily living skills.  Rest your affected joints as directed by your health care provider.  Keep all follow-up visits as directed by your health care provider. SEEK MEDICAL CARE IF:   Your skin turns red.  You develop a rash in addition  to your joint pain.  You have worsening joint pain.  You have a fever along with joint or muscle aches. SEEK IMMEDIATE MEDICAL CARE IF:  You have a significant loss of weight or  appetite.  You have night sweats. [place] of Arthritis and Musculoskeletal and Skin Diseases: www.niams.SouthExposed.es  [employer] on Aging: http://kim-miller.com/  American College of Rheumatology: www.rheumatology.org   This information is not intended to replace advice given to you by your health care provider. Make sure you discuss any questions you have with your health care provider.   Document Released: 01/13/2005 Document Revised: 02/03/2014 Document Reviewed: 09/20/2012 Elsevier Interactive Patient Education [employer].

## 2015-06-02 ENCOUNTER — Emergency Department (HOSPITAL_COMMUNITY)
Admission: EM | Admit: 2015-06-02 | Discharge: 2015-06-02 | Disposition: A | Discharge status: Home / Self Care | Payer: Self-pay | Attending: Emergency Medicine | Admitting: Emergency Medicine

## 2015-06-02 ENCOUNTER — Encounter (HOSPITAL_COMMUNITY): Payer: Self-pay | Admitting: Emergency Medicine

## 2015-06-02 ENCOUNTER — Emergency Department (HOSPITAL_COMMUNITY): Payer: Self-pay

## 2015-06-02 DIAGNOSIS — F17210 Nicotine dependence, cigarettes, uncomplicated: Secondary | ICD-10-CM | POA: Insufficient documentation

## 2015-06-02 DIAGNOSIS — Y999 Unspecified external cause status: Secondary | ICD-10-CM | POA: Insufficient documentation

## 2015-06-02 DIAGNOSIS — S8002XA Contusion of left knee, initial encounter: Secondary | ICD-10-CM | POA: Insufficient documentation

## 2015-06-02 DIAGNOSIS — W228XXA Striking against or struck by other objects, initial encounter: Secondary | ICD-10-CM | POA: Insufficient documentation

## 2015-06-02 DIAGNOSIS — J449 Chronic obstructive pulmonary disease, unspecified: Secondary | ICD-10-CM | POA: Insufficient documentation

## 2015-06-02 DIAGNOSIS — Y92239 Unspecified place in hospital as the place of occurrence of the external cause: Secondary | ICD-10-CM | POA: Insufficient documentation

## 2015-06-02 DIAGNOSIS — Y939 Activity, unspecified: Secondary | ICD-10-CM | POA: Insufficient documentation

## 2015-06-02 DIAGNOSIS — I10 Essential (primary) hypertension: Secondary | ICD-10-CM | POA: Insufficient documentation

## 2015-06-02 NOTE — Discharge Instructions (Signed)
Your blood pressure is somewhat elevated. Otherwise your vital signs within normal limits. The x-ray of your knee is negative for fracture, dislocation, or effusion. Please see Dr. Romeo AppleHarrison for orthopedic evaluation if not improving.

## 2015-06-02 NOTE — ED Notes (Signed)
Pt states she ran her left knee into a crank on the end of a hospital bed.

## 2015-06-02 NOTE — ED Provider Notes (Signed)
CSN: [id number]     Arrival date & time 06/02/15  1830 History   First MD Initiated Contact with Patient 06/02/15 1838     Chief Complaint  Patient presents with  . Knee Injury     (Consider location/radiation/quality/duration/timing/severity/associated sxs/prior Treatment) HPI Comments: Patient is a 50 year old female who presents to the emergency department with a complaint of knee pain.  The patient states that earlier today she hit her left knee with the crack of a hospital bed. She has had pain since that time. The patient denies being on any anticoagulation medications. She's not had any previous operations or procedures involving the left knee. No other injury reported.  The history is provided by the patient.    Past Medical History  Diagnosis Date  . COPD (chronic obstructive pulmonary disease) (HCC)   . Anemia   . Pleurisy   . GERD (gastroesophageal reflux disease)   . Hypertension   . Sciatica    Past Surgical History  Procedure Laterality Date  . Tubal ligation    . Cyst removed from right face    . Esophagogastroduodenoscopy  11/07/2010    Procedure: ESOPHAGOGASTRODUODENOSCOPY (EGD);  Surgeon: Corbin Ade, MD;  Location: [facility];  Service: Endoscopy;  Laterality: N/A;  . Dilitation & currettage/hystroscopy with thermachoice ablation N/A 11/17/2012    Procedure: DILATATION & CURETTAGE/HYSTEROSCOPY WITH THERMACHOICE ABLATION;  Surgeon: Lazaro Arms, MD;  Location: [facility];  Service: Gynecology;  Laterality: N/A;  Total D5 in 21ml, total D5 out 21ml; temp 86-87 degrees Celcius; total time 9 minutes and 36 seconds;   . Ablation     Family History  Problem Relation Age of Onset  . Colon cancer Father 62    deceased secondary to kidney failure  . Diabetes Father   . Colon cancer Brother 3    recent diagnosis (2012). Finishing chemotherapy.  . Breast cancer Maternal Aunt   . Diabetes Mother   . Diabetes Brother   . Hypertension Brother   . Diabetes  Maternal Grandmother    Social History  Substance Use Topics  . Smoking status: Current Every Day Smoker -- 0.50 packs/day for 20 years    Types: Cigarettes  . Smokeless tobacco: Never Used  . Alcohol Use: No   OB History    Gravida Para Term Preterm AB TAB SAB Ectopic Multiple Living   2 2 2       2      Review of Systems  Constitutional: Negative for activity change.       All ROS Neg except as noted in HPI  HENT: Negative for nosebleeds.   Eyes: Negative for photophobia and discharge.  Respiratory: Negative for cough, shortness of breath and wheezing.   Cardiovascular: Negative for chest pain and palpitations.  Gastrointestinal: Negative for abdominal pain and blood in stool.  Genitourinary: Negative for dysuria, frequency and hematuria.  Musculoskeletal: Positive for arthralgias. Negative for back pain and neck pain.  Skin: Negative.   Neurological: Negative for dizziness, seizures and speech difficulty.  Psychiatric/Behavioral: Negative for hallucinations and confusion.      Allergies  Tramadol and Tylenol  Home Medications   Prior to Admission medications   Medication Sig Start Date End Date Taking? Authorizing Provider  Aspirin-Caffeine (BC FAST PAIN RELIEF PO) Take 1 packet by mouth daily as needed (pain).    Historical Provider, MD  cyclobenzaprine (FLEXERIL) 10 MG tablet Take 1 tablet (10 mg total) by mouth 2 (two) times daily as needed for muscle  spasms. 03/30/15   Elson Areas, PA-C  dexamethasone (DECADRON) 4 MG tablet Take 1 tablet (4 mg total) by mouth 2 (two) times daily with a meal. 05/25/15   Ivery Quale, PA-C  HYDROcodone-acetaminophen (NORCO/VICODIN) 5-325 MG tablet Take 2 tablets by mouth every 4 (four) hours as needed. 03/30/15   Elson Areas, PA-C  lisinopril (PRINIVIL,ZESTRIL) 10 MG tablet Take 10 mg by mouth daily.    Historical Provider, MD  meloxicam (MOBIC) 15 MG tablet Take 1 tablet (15 mg total) by mouth daily. 05/25/15   Ivery Quale, PA-C   methocarbamol (ROBAXIN) 500 MG tablet Take 1 tablet (500 mg total) by mouth 3 (three) times daily. 05/25/15   Ivery Quale, PA-C  naproxen (NAPROSYN) 375 MG tablet Take 1 tablet (375 mg total) by mouth 2 (two) times daily. Patient not taking: Reported on 02/04/2015 12/09/14   Arthor Captain, PA-C  omeprazole (PRILOSEC OTC) 20 MG tablet Take 20 mg by mouth daily.    Historical Provider, MD   BP 161/111 mmHg  Pulse 86  Temp(Src) 98.5 F (36.9 C) (Temporal)  Resp 20  Ht  (1.6 m)  Wt 65.772 kg  BMI 25.69 kg/m2  SpO2 99% Physical Exam  Constitutional: She is oriented to person, place, and time. She appears well-developed and well-nourished.  Non-toxic appearance.  HENT:  Head: Normocephalic.  Right Ear: Tympanic membrane and external ear normal.  Left Ear: Tympanic membrane and external ear normal.  Eyes: EOM and lids are normal. Pupils are equal, round, and reactive to light.  Neck: Normal range of motion. Neck supple. Carotid bruit is not present.  Cardiovascular: Normal rate, regular rhythm, normal heart sounds, intact distal pulses and normal pulses.   Pulmonary/Chest: Breath sounds normal. No respiratory distress.  Abdominal: Soft. Bowel sounds are normal. There is no tenderness. There is no guarding.  Musculoskeletal: Normal range of motion. She exhibits tenderness.       Left knee: Tenderness found.  No effusion or deformity of the left knee.  Lymphadenopathy:       Head (right side): No submandibular adenopathy present.       Head (left side): No submandibular adenopathy present.    She has no cervical adenopathy.  Neurological: She is alert and oriented to person, place, and time. She has normal strength. No cranial nerve deficit or sensory deficit. She exhibits normal muscle tone. Coordination normal.  Pt is ambulatory, but with a limp.  Skin: Skin is warm and dry.  Psychiatric: She has a normal mood and affect. Her speech is normal.  Nursing note and vitals  reviewed.   ED Course  Procedures (including critical care time) Labs Review Labs Reviewed - No data to display  Imaging Review No results found. I have personally reviewed and evaluated these images and lab results as part of my medical decision-making.   EKG Interpretation None      MDM  Blood pressure elevated at 161/111. I've advised the patient of her elevation. I also reminded the patient of her elevation on her previous visit to the emergency room on April 28. The patient acknowledges understanding of the elevation on, and the need to see her primary physician concerning this.  X-ray of the left knee is negative for fracture or dislocation. No effusion is appreciated. The patient has been given the information concerning her knee. She will follow-up with orthopedics if not improving.    Final diagnoses:  Contusion, knee, left, initial encounter    **I have reviewed  nursing notes, vital signs, and all appropriate lab and imaging results for this patient.    Ivery QualeHobson Davelyn Gwinn, PA-C 06/02/15 1919  Doug SouSam Jacubowitz, MD 06/02/15 [phone]

## 2015-06-24 ENCOUNTER — Encounter (HOSPITAL_COMMUNITY): Payer: Self-pay | Admitting: Emergency Medicine

## 2015-06-24 ENCOUNTER — Emergency Department (HOSPITAL_COMMUNITY)
Admission: EM | Admit: 2015-06-24 | Discharge: 2015-06-24 | Disposition: A | Discharge status: Home / Self Care | Payer: PRIVATE HEALTH INSURANCE | Attending: Emergency Medicine | Admitting: Emergency Medicine

## 2015-06-24 DIAGNOSIS — J449 Chronic obstructive pulmonary disease, unspecified: Secondary | ICD-10-CM | POA: Insufficient documentation

## 2015-06-24 DIAGNOSIS — Z7982 Long term (current) use of aspirin: Secondary | ICD-10-CM | POA: Insufficient documentation

## 2015-06-24 DIAGNOSIS — I10 Essential (primary) hypertension: Secondary | ICD-10-CM | POA: Insufficient documentation

## 2015-06-24 DIAGNOSIS — R300 Dysuria: Secondary | ICD-10-CM | POA: Insufficient documentation

## 2015-06-24 DIAGNOSIS — Z79899 Other long term (current) drug therapy: Secondary | ICD-10-CM | POA: Insufficient documentation

## 2015-06-24 DIAGNOSIS — R51 Headache: Secondary | ICD-10-CM | POA: Insufficient documentation

## 2015-06-24 DIAGNOSIS — M5442 Lumbago with sciatica, left side: Secondary | ICD-10-CM | POA: Insufficient documentation

## 2015-06-24 DIAGNOSIS — F17210 Nicotine dependence, cigarettes, uncomplicated: Secondary | ICD-10-CM | POA: Insufficient documentation

## 2015-06-24 LAB — URINE MICROSCOPIC-ADD ON

## 2015-06-24 LAB — URINALYSIS, ROUTINE W REFLEX MICROSCOPIC
Bilirubin Urine: NEGATIVE
Glucose, UA: NEGATIVE mg/dL
Ketones, ur: NEGATIVE mg/dL
Leukocytes, UA: NEGATIVE
NITRITE: NEGATIVE
Protein, ur: NEGATIVE mg/dL
pH: 6 (ref 5.0–8.0)

## 2015-06-24 LAB — PREGNANCY, URINE: PREG TEST UR: NEGATIVE

## 2015-06-24 MED ORDER — IBUPROFEN 600 MG PO TABS: 600.0000 mg | ORAL_TABLET | Freq: Four times a day (QID) | ORAL | Status: DC | PRN

## 2015-06-24 MED ORDER — CYCLOBENZAPRINE HCL 5 MG PO TABS: 5.0000 mg | ORAL_TABLET | Freq: Three times a day (TID) | ORAL | Status: DC | PRN

## 2015-06-24 MED ORDER — NAPROXEN 250 MG PO TABS
500.0000 mg | ORAL_TABLET | Freq: Once | ORAL | Status: AC
  Administered 2015-06-24: 500 mg via ORAL
  Filled 2015-06-24: qty 2

## 2015-06-24 MED ORDER — OXYCODONE HCL 5 MG PO TABS: 5.0000 mg | ORAL_TABLET | ORAL | Status: DC | PRN

## 2015-06-24 MED ORDER — OXYCODONE HCL 5 MG PO TABS
5.0000 mg | ORAL_TABLET | Freq: Once | ORAL | Status: AC
  Administered 2015-06-24: 5 mg via ORAL
  Filled 2015-06-24: qty 1

## 2015-06-24 NOTE — Discharge Instructions (Signed)
Sciatica °Sciatica is pain, weakness, numbness, or tingling along the path of the sciatic nerve. The nerve starts in the lower back and runs down the back of each leg. The nerve controls the muscles in the lower leg and in the back of the knee, while also providing sensation to the back of the thigh, lower leg, and the sole of your foot. Sciatica is a symptom of another medical condition. For instance, nerve damage or certain conditions, such as a herniated disk or bone spur on the spine, pinch or put pressure on the sciatic nerve. This causes the pain, weakness, or other sensations normally associated with sciatica. Generally, sciatica only affects one side of the body. °CAUSES  °· Herniated or slipped disc. °· Degenerative disk disease. °· A pain disorder involving the narrow muscle in the buttocks (piriformis syndrome). °· Pelvic injury or fracture. °· Pregnancy. °· Tumor (rare). °SYMPTOMS  °Symptoms can vary from mild to very severe. The symptoms usually travel from the low back to the buttocks and down the back of the leg. Symptoms can include: °· Mild tingling or dull aches in the lower back, leg, or hip. °· Numbness in the back of the calf or sole of the foot. °· Burning sensations in the lower back, leg, or hip. °· Sharp pains in the lower back, leg, or hip. °· Leg weakness. °· Severe back pain inhibiting movement. °These symptoms may get worse with coughing, sneezing, laughing, or prolonged sitting or standing. Also, being overweight may worsen symptoms. °DIAGNOSIS  °Your caregiver will perform a physical exam to look for common symptoms of sciatica. He or she may ask you to do certain movements or activities that would trigger sciatic nerve pain. Other tests may be performed to find the cause of the sciatica. These may include: °· Blood tests. °· X-rays. °· Imaging tests, such as an MRI or CT scan. °TREATMENT  °Treatment is directed at the cause of the sciatic pain. Sometimes, treatment is not necessary  and the pain and discomfort goes away on its own. If treatment is needed, your caregiver may suggest: °· Over-the-counter medicines to relieve pain. °· Prescription medicines, such as anti-inflammatory medicine, muscle relaxants, or narcotics. °· Applying heat or ice to the painful area. °· Steroid injections to lessen pain, irritation, and inflammation around the nerve. °· Reducing activity during periods of pain. °· Exercising and stretching to strengthen your abdomen and improve flexibility of your spine. Your caregiver may suggest losing weight if the extra weight makes the back pain worse. °· Physical therapy. °· Surgery to eliminate what is pressing or pinching the nerve, such as a bone spur or part of a herniated disk. °HOME CARE INSTRUCTIONS  °· Only take over-the-counter or prescription medicines for pain or discomfort as directed by your caregiver. °· Apply ice to the affected area for 20 minutes, 3-4 times a day for the first 48-72 hours. Then try heat in the same way. °· Exercise, stretch, or perform your usual activities if these do not aggravate your pain. °· Attend physical therapy sessions as directed by your caregiver. °· Keep all follow-up appointments as directed by your caregiver. °· Do not wear high heels or shoes that do not provide proper support. °· Check your mattress to see if it is too soft. A firm mattress may lessen your pain and discomfort. °SEEK IMMEDIATE MEDICAL CARE IF:  °· You lose control of your bowel or bladder (incontinence). °· You have increasing weakness in the lower back, pelvis, buttocks,   or legs.  You have redness or swelling of your back.  You have a burning sensation when you urinate.  You have pain that gets worse when you lie down or awakens you at night.  Your pain is worse than you have experienced in the past.  Your pain is lasting longer than 4 weeks.  You are suddenly losing weight without reason. MAKE SURE YOU:  Understand these  instructions.  Will watch your condition.  Will get help right away if you are not doing well or get worse.   This information is not intended to replace advice given to you by your health care provider. Make sure you discuss any questions you have with your health care provider.   Document Released: 01/07/2001 Document Revised: 10/04/2014 Document Reviewed: 05/25/2011 Elsevier Interactive Patient Education [employer]2016 [employer]     Use the the other medicines as directed.  Do not drive within 4 hours of taking oxycodone as this will make you drowsy.  Avoid lifting,  Bending,  Twisting or any other activity that worsens your pain over the next week.  Apply an  icepack  to your lower back for 10-15 minutes every 2 hours for the next 2 days.  You should get rechecked if your symptoms are not better over the next 5 days,  Or you develop increased pain,  Weakness in your leg(s) or loss of bladder or bowel function - these are symptoms of a worse injury.

## 2015-06-24 NOTE — ED Notes (Signed)
PA at bedside.

## 2015-06-24 NOTE — ED Provider Notes (Signed)
CSN: [id number]     Arrival date & time 06/24/15  1030 History  By signing my name below, I, Mesha Guinyard, attest that this documentation has been prepared under the direction and in the presence of Treatment Team:  Physician Assistant: Burgess AmorJulie Cathey Fredenburg, PA-C.  Electronically Signed: Arvilla MarketMesha Guinyard, Medical Scribe. 06/24/2015. 11:25 AM.   Chief Complaint  Patient presents with  . Back Pain   The history is provided by the patient. No language interpreter was used.  HPI Comments: [name] is a 50 y.o. female with a PMHx of sciatica who presents to the Emergency Department complaining of lower back pain without injury onset 2 days. Pt mentions the pain radiates to the lower thigh region of her left leg. Pt describes the pain as burning. Pt mentions she had similar symptoms 1.5 months ago. 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 Pt reports her urine has been slightly cloudy and her bladder still feels full after she empties it for 2 weeks now. She has tried laying in bed, ibuprofen, goodies, tylenol, and BC this morning with no relief for her symptoms. Pt reports her buttock muscles are sore as if she's been working out. She reports LMP November 2016. Pt states she experienced light spotting for a day with severe cramps onset 2-3 days ago. Pt mentions she's had a bad HA since onset of her back pain. She denies hematuria, melena, and any other symptoms.    Past Medical History  Diagnosis Date  . COPD (chronic obstructive pulmonary disease) (HCC)   . Anemia   . Pleurisy   . GERD (gastroesophageal reflux disease)   . Hypertension   . Sciatica    Past Surgical History  Procedure Laterality Date  . Tubal ligation    . Cyst removed from right face    . Esophagogastroduodenoscopy  11/07/2010    Procedure: ESOPHAGOGASTRODUODENOSCOPY (EGD);  Surgeon: Corbin Adeobert M Rourk, MD;  Location: [facility];  Service: Endoscopy;  Laterality: N/A;  . Dilitation &  currettage/hystroscopy with thermachoice ablation N/A 11/17/2012    Procedure: DILATATION & CURETTAGE/HYSTEROSCOPY WITH THERMACHOICE ABLATION;  Surgeon: Lazaro ArmsLuther H Eure, MD;  Location: [facility];  Service: Gynecology;  Laterality: N/A;  Total D5 in 21ml, total D5 out 21ml; temp 86-87 degrees Celcius; total time 9 minutes and 36 seconds;   . Ablation     Family History  Problem Relation Age of Onset  . Colon cancer Father 6940    deceased secondary to kidney failure  . Diabetes Father   . Colon cancer Brother [id number]    recent diagnosis (2012). Finishing chemotherapy.  . Breast cancer Maternal Aunt   . Diabetes Mother   . Diabetes Brother   . Hypertension Brother   . Diabetes Maternal Grandmother    Social History  Substance Use Topics  . Smoking status: Current Every Day Smoker -- 0.50 packs/day for 20 years    Types: Cigarettes  . Smokeless tobacco: Never Used  . Alcohol Use: No   OB History    Gravida Para Term Preterm AB TAB SAB Ectopic Multiple Living   2 2 2       2      Review of Systems  Gastrointestinal: Negative for blood in stool.  Genitourinary: Positive for dysuria. Negative for hematuria and menstrual problem.  Musculoskeletal: Positive for back pain.  Neurological: Positive for headaches.   Allergies  Tramadol and Tylenol  Home Medications   Prior to Admission  medications   Medication Sig Start Date End Date Taking? Authorizing Provider  Aspirin-Caffeine (BC FAST PAIN RELIEF PO) Take 1 packet by mouth daily as needed (pain).   Yes Historical Provider, MD  lisinopril (PRINIVIL,ZESTRIL) 10 MG tablet Take 10 mg by mouth daily.   Yes Historical Provider, MD  omeprazole (PRILOSEC OTC) 20 MG tablet Take 20 mg by mouth daily.   Yes Historical Provider, MD  cyclobenzaprine (FLEXERIL) 5 MG tablet Take 1 tablet (5 mg total) by mouth 3 (three) times daily as needed for muscle spasms. 06/24/15   Burgess Amor, PA-C  ibuprofen (ADVIL,MOTRIN) 600 MG tablet Take 1 tablet (600 mg total) by  mouth every 6 (six) hours as needed. 06/24/15   Burgess Amor, PA-C  oxyCODONE (ROXICODONE) 5 MG immediate release tablet Take 1 tablet (5 mg total) by mouth every 4 (four) hours as needed for severe pain. 06/24/15   Burgess Amor, PA-C   BP 148/97 mmHg  Pulse 66  Temp(Src) 97.9 F (36.6 C) (Oral)  Resp 16  Ht  (1.6 m)  Wt 63.504 kg  BMI 24.81 kg/m2  SpO2 100% Physical Exam  Constitutional: She appears well-developed and well-nourished. No distress.  HENT:  Head: Normocephalic and atraumatic.  Eyes: Conjunctivae are normal.  Neck: Neck supple.  Cardiovascular: Normal rate.   Pulmonary/Chest: Effort normal.  Neurological: She is alert.  Skin: Skin is warm and dry.  Psychiatric: She has a normal mood and affect. Her behavior is normal.  Nursing note and vitals reviewed.   ED Course  Procedures DIAGNOSTIC STUDIES: Oxygen Saturation is 97% on RA, NL by my interpretation.    COORDINATION OF CARE: 4:43 PM Discussed treatment plan with pt at bedside and pt agreed to plan.   Labs Review Labs Reviewed  URINALYSIS, ROUTINE W REFLEX MICROSCOPIC (NOT AT [facility]) - Abnormal; Notable for the following:    Specific Gravity, Urine <1.005 (*)    Hgb urine dipstick SMALL (*)    All other components within normal limits  URINE MICROSCOPIC-ADD ON - Abnormal; Notable for the following:    Squamous Epithelial / LPF 0-5 (*)    Bacteria, UA RARE (*)    All other components within normal limits  PREGNANCY, URINE   Urine pregnancy negative  Imaging Review No results found. I have personally reviewed and evaluated these images and lab results as part of my medical decision-making.   EKG Interpretation None      MDM   Final diagnoses:  Left-sided low back pain with left-sided sciatica    No neuro deficit on exam or by history to suggest emergent or surgical presentation. Discussed worsened sx that should prompt immediate re-evaluation including distal weakness, bowel/bladder  retention/incontinence.  Patient was placed on ibuprofen, Flexeril and a few oxycodone tablets.  She was advised follow-up care by her PCP for further evaluation if symptoms persist or worsen.         Burgess Amor, PA-C 06/25/15 1646  Bethann Berkshire, MD 06/26/15 [phone]

## 2015-06-24 NOTE — ED Notes (Signed)
Pt reports lower back pain radiating down left leg x2 days, denies injury.

## 2015-07-20 ENCOUNTER — Emergency Department (HOSPITAL_COMMUNITY)
Admission: EM | Admit: 2015-07-20 | Discharge: 2015-07-20 | Disposition: A | Discharge status: Home / Self Care | Payer: PRIVATE HEALTH INSURANCE | Attending: Emergency Medicine | Admitting: Emergency Medicine

## 2015-07-20 ENCOUNTER — Encounter (HOSPITAL_COMMUNITY): Payer: Self-pay | Admitting: Emergency Medicine

## 2015-07-20 ENCOUNTER — Other Ambulatory Visit: Payer: Self-pay

## 2015-07-20 ENCOUNTER — Emergency Department (HOSPITAL_COMMUNITY): Payer: PRIVATE HEALTH INSURANCE

## 2015-07-20 DIAGNOSIS — M7918 Myalgia, other site: Secondary | ICD-10-CM

## 2015-07-20 DIAGNOSIS — R52 Pain, unspecified: Secondary | ICD-10-CM | POA: Insufficient documentation

## 2015-07-20 DIAGNOSIS — Z7982 Long term (current) use of aspirin: Secondary | ICD-10-CM | POA: Insufficient documentation

## 2015-07-20 DIAGNOSIS — Z791 Long term (current) use of non-steroidal anti-inflammatories (NSAID): Secondary | ICD-10-CM | POA: Insufficient documentation

## 2015-07-20 DIAGNOSIS — I10 Essential (primary) hypertension: Secondary | ICD-10-CM | POA: Insufficient documentation

## 2015-07-20 DIAGNOSIS — J441 Chronic obstructive pulmonary disease with (acute) exacerbation: Secondary | ICD-10-CM | POA: Insufficient documentation

## 2015-07-20 DIAGNOSIS — F17210 Nicotine dependence, cigarettes, uncomplicated: Secondary | ICD-10-CM | POA: Insufficient documentation

## 2015-07-20 LAB — CBC WITH DIFFERENTIAL/PLATELET
Basophils Absolute: 0 10*3/uL (ref 0.0–0.1)
Basophils Relative: 0 %
Eosinophils Absolute: 0.2 10*3/uL (ref 0.0–0.7)
Eosinophils Relative: 2 %
HEMATOCRIT: 39.4 % (ref 36.0–46.0)
Hemoglobin: 13.1 g/dL (ref 12.0–15.0)
LYMPHS PCT: 34 %
Lymphs Abs: 3.1 10*3/uL (ref 0.7–4.0)
MCH: 28.5 pg (ref 26.0–34.0)
MCHC: 33.2 g/dL (ref 30.0–36.0)
MCV: 85.8 fL (ref 78.0–100.0)
MONO ABS: 0.3 10*3/uL (ref 0.1–1.0)
MONOS PCT: 3 %
NEUTROS ABS: 5.7 10*3/uL (ref 1.7–7.7)
Neutrophils Relative %: 61 %
Platelets: 118 10*3/uL — ABNORMAL LOW (ref 150–400)
RBC: 4.59 MIL/uL (ref 3.87–5.11)
RDW: 14.5 % (ref 11.5–15.5)
WBC: 9.3 10*3/uL (ref 4.0–10.5)

## 2015-07-20 LAB — BASIC METABOLIC PANEL
ANION GAP: 6 (ref 5–15)
BUN: 12 mg/dL (ref 6–20)
CALCIUM: 8.4 mg/dL — AB (ref 8.9–10.3)
CO2: 23 mmol/L (ref 22–32)
CREATININE: 0.63 mg/dL (ref 0.44–1.00)
Chloride: 110 mmol/L (ref 101–111)
GFR calc Af Amer: >60 mL/min (ref >60)
GFR calc non Af Amer: >60 mL/min (ref >60)
GLUCOSE: 97 mg/dL (ref 65–99)
Potassium: 3.4 mmol/L — ABNORMAL LOW (ref 3.5–5.1)
Sodium: 139 mmol/L (ref 135–145)

## 2015-07-20 LAB — URINALYSIS, ROUTINE W REFLEX MICROSCOPIC
BILIRUBIN URINE: NEGATIVE
Glucose, UA: NEGATIVE mg/dL
Ketones, ur: NEGATIVE mg/dL
Leukocytes, UA: NEGATIVE
NITRITE: NEGATIVE
PH: 6 (ref 5.0–8.0)
Protein, ur: NEGATIVE mg/dL
SPECIFIC GRAVITY, URINE: 1.015 (ref 1.005–1.030)

## 2015-07-20 LAB — URINE MICROSCOPIC-ADD ON
Bacteria, UA: NONE SEEN
WBC UA: NONE SEEN WBC/hpf (ref 0–5)

## 2015-07-20 LAB — TROPONIN I: Troponin I: <0.03 ng/mL (ref <0.031)

## 2015-07-20 LAB — D-DIMER, QUANTITATIVE: D-Dimer, Quant: <0.27 ug/mL-FEU (ref 0.00–0.50)

## 2015-07-20 MED ORDER — METHOCARBAMOL 500 MG PO TABS: 1000.0000 mg | ORAL_TABLET | Freq: Four times a day (QID) | ORAL | Status: DC | PRN

## 2015-07-20 MED ORDER — IPRATROPIUM-ALBUTEROL 0.5-2.5 (3) MG/3ML IN SOLN
3.0000 mL | Freq: Once | RESPIRATORY_TRACT | Status: AC
  Administered 2015-07-20: 3 mL via RESPIRATORY_TRACT
  Filled 2015-07-20: qty 3

## 2015-07-20 MED ORDER — OXYCODONE-ACETAMINOPHEN 5-325 MG PO TABS
1.0000 | ORAL_TABLET | Freq: Once | ORAL | Status: AC
  Administered 2015-07-20: 1 via ORAL
  Filled 2015-07-20: qty 1

## 2015-07-20 MED ORDER — ALBUTEROL SULFATE HFA 108 (90 BASE) MCG/ACT IN AERS: 2.0000 | INHALATION_SPRAY | RESPIRATORY_TRACT | Status: AC | PRN

## 2015-07-20 MED ORDER — PREDNISONE 20 MG PO TABS: 40.0000 mg | ORAL_TABLET | Freq: Every day | ORAL | Status: DC

## 2015-07-20 MED ORDER — AZITHROMYCIN 250 MG PO TABS
ORAL_TABLET | ORAL | Status: DC
Start: 2015-07-20 — End: 2016-06-15

## 2015-07-20 MED ORDER — ALBUTEROL SULFATE (2.5 MG/3ML) 0.083% IN NEBU
2.5000 mg | INHALATION_SOLUTION | Freq: Once | RESPIRATORY_TRACT | Status: AC
  Administered 2015-07-20: 2.5 mg via RESPIRATORY_TRACT
  Filled 2015-07-20: qty 3

## 2015-07-20 MED ORDER — PREDNISONE 50 MG PO TABS
60.0000 mg | ORAL_TABLET | Freq: Once | ORAL | Status: AC
  Administered 2015-07-20: 60 mg via ORAL
  Filled 2015-07-20: qty 1

## 2015-07-20 NOTE — ED Notes (Addendum)
Pt reports cough and sob for last several days, urinary frequency starting last night. Pt also reports has been "under a lot of stress lately." nad noted. Moderate anxiety noted in triage. Pt tearful. Pt denies productive cough. Pt reports pain is worse with a deep breath or movement. Pt able to speak clear completed sentences.

## 2015-07-20 NOTE — ED Provider Notes (Signed)
CSN: [id number]     Arrival date & time 07/20/15  1031 History   First MD Initiated Contact with Patient 07/20/15 1040     Chief Complaint  Patient presents with  . Cough     HPI Pt was seen at 1045.  Per pt, c/o gradual onset and worsening of persistent cough and SOB for the past 3 days. Has been associated with "sharp" "stabbing" chest and back pain. Pain is worse with deep breath and movement. Pt also c/o urinary frequency since last night.  Denies palpitations, no flank pain, no abd pain, no N/V/D, no fevers, no rash, no hematuria, no vaginal bleeding/discharge.     Past Medical History  Diagnosis Date  . COPD (chronic obstructive pulmonary disease) (HCC)   . Anemia   . Pleurisy   . GERD (gastroesophageal reflux disease)   . Hypertension   . Sciatica    Past Surgical History  Procedure Laterality Date  . Tubal ligation    . Cyst removed from right face    . Esophagogastroduodenoscopy  11/07/2010    Procedure: ESOPHAGOGASTRODUODENOSCOPY (EGD);  Surgeon: Corbin Adeobert M Rourk, MD;  Location: [facility];  Service: Endoscopy;  Laterality: N/A;  . Dilitation & currettage/hystroscopy with thermachoice ablation N/A 11/17/2012    Procedure: DILATATION & CURETTAGE/HYSTEROSCOPY WITH THERMACHOICE ABLATION;  Surgeon: Lazaro ArmsLuther H Eure, MD;  Location: [facility];  Service: Gynecology;  Laterality: N/A;  Total D5 in 21ml, total D5 out 21ml; temp 86-87 degrees Celcius; total time 9 minutes and 36 seconds;   . Ablation     Family History  Problem Relation Age of Onset  . Colon cancer Father [id number]    deceased secondary to kidney failure  . Diabetes Father   . Colon cancer Brother 4042    recent diagnosis (2012). Finishing chemotherapy.  . Breast cancer Maternal Aunt   . Diabetes Mother   . Diabetes Brother   . Hypertension Brother   . Diabetes Maternal Grandmother    Social History  Substance Use Topics  . Smoking status: Current Every Day Smoker -- 0.25 packs/day for 20 years    Types: Cigarettes   . Smokeless tobacco: Never Used  . Alcohol Use: No   OB History    Gravida Para Term Preterm AB TAB SAB Ectopic Multiple Living   2 2 2       2      Review of Systems ROS: Statement: All systems negative except as marked or noted in the HPI; Constitutional: Negative for fever and chills. ; ; Eyes: Negative for eye pain, redness and discharge. ; ; ENMT: Negative for ear pain, hoarseness, nasal congestion, sinus pressure and sore throat. ; ; Cardiovascular: Negative for palpitations, diaphoresis, and peripheral edema. ; ; Respiratory: +SOB, cough, CP. Negative for wheezing and stridor. ; ; Gastrointestinal: Negative for nausea, vomiting, diarrhea, abdominal pain, blood in stool, hematemesis, jaundice and rectal bleeding. . ; ; Genitourinary: +dysuria. Negative for flank pain and hematuria. ; ; Musculoskeletal: Negative for back pain and neck pain. Negative for swelling and trauma.; ; Skin: Negative for pruritus, rash, abrasions, blisters, bruising and skin lesion.; ; Neuro: Negative for headache, lightheadedness and neck stiffness. Negative for weakness, altered level of consciousness, altered mental status, extremity weakness, paresthesias, involuntary movement, seizure and syncope.      Allergies  Tramadol and Tylenol  Home Medications   Prior to Admission medications   Medication Sig Start Date End Date Taking? Authorizing Provider  Aspirin-Caffeine (BC FAST PAIN RELIEF PO) Take 1  packet by mouth daily as needed (pain).    Historical Provider, MD  cyclobenzaprine (FLEXERIL) 5 MG tablet Take 1 tablet (5 mg total) by mouth 3 (three) times daily as needed for muscle spasms. 06/24/15   Burgess Amor, PA-C  ibuprofen (ADVIL,MOTRIN) 600 MG tablet Take 1 tablet (600 mg total) by mouth every 6 (six) hours as needed. 06/24/15   Burgess Amor, PA-C  lisinopril (PRINIVIL,ZESTRIL) 10 MG tablet Take 10 mg by mouth daily.    Historical Provider, MD  omeprazole (PRILOSEC OTC) 20 MG tablet Take 20 mg by mouth  daily.    Historical Provider, MD  oxyCODONE (ROXICODONE) 5 MG immediate release tablet Take 1 tablet (5 mg total) by mouth every 4 (four) hours as needed for severe pain. 06/24/15   Burgess Amor, PA-C   BP 174/102 mmHg  Pulse 82  Temp(Src) 97.8 F (36.6 C) (Oral)  Resp 18  Ht  (1.676 m)  Wt 140 lb (63.504 kg)  BMI 22.61 kg/m2  SpO2 97% Physical Exam  1050: Physical examination:  Nursing notes reviewed; Vital signs and O2 SAT reviewed;  Constitutional: Well developed, Well nourished, Well hydrated, In no acute distress; Head:  Normocephalic, atraumatic; Eyes: EOMI, PERRL, No scleral icterus; ENMT: Mouth and pharynx normal, Mucous membranes moist; Neck: Supple, Full range of motion, No lymphadenopathy; Cardiovascular: Regular rate and rhythm, No gallop; Respiratory: Breath sounds coarse & equal bilaterally, No wheezes.  Speaking full sentences with ease, Normal respiratory effort/excursion; Chest: Nontender, Movement normal; Abdomen: Soft, Nontender, Nondistended, Normal bowel sounds; Genitourinary: No CVA tenderness; Extremities: Pulses normal, No tenderness, No edema, No calf edema or asymmetry.; Neuro: AA&Ox3, Major CN grossly intact.  Speech clear. No gross focal motor or sensory deficits in extremities.; Skin: Color normal, Warm, Dry.    ED Course  Procedures (including critical care time) Labs Review  Imaging Review  I have personally reviewed and evaluated these images and lab results as part of my medical decision-making.   EKG Interpretation   Date/Time:  Friday July 20 2015 10:57:47 EDT Ventricular Rate:  77 PR Interval:    QRS Duration: 96 QT Interval:  390 QTC Calculation: 442 R Axis:   72 Text Interpretation:  Sinus rhythm When compared with ECG of 12/06/2013 No  significant change was found Confirmed by [facility]  MD, Nicholos Johns [phone]) on  07/20/2015 11:16:17 AM      MDM  MDM Reviewed: previous chart, nursing note and vitals Reviewed previous: labs and  ECG Interpretation: labs, ECG and x-ray     Results for orders placed or performed during the hospital encounter of 07/20/15  Troponin I  Result Value Ref Range   Troponin I <0.03 <0.031 ng/mL  CBC with Differential  Result Value Ref Range   WBC 9.3 4.0 - 10.5 K/uL   RBC 4.59 3.87 - 5.11 MIL/uL   Hemoglobin 13.1 12.0 - 15.0 g/dL   HCT 25.9 56.3 - 87.5 %   MCV 85.8 78.0 - 100.0 fL   MCH 28.5 26.0 - 34.0 pg   MCHC 33.2 30.0 - 36.0 g/dL   RDW 64.3 32.9 - 51.8 %   Platelets 118 (L) 150 - 400 K/uL   Neutrophils Relative % 61 %   Neutro Abs 5.7 1.7 - 7.7 K/uL   Lymphocytes Relative 34 %   Lymphs Abs 3.1 0.7 - 4.0 K/uL   Monocytes Relative 3 %   Monocytes Absolute 0.3 0.1 - 1.0 K/uL   Eosinophils Relative 2 %   Eosinophils Absolute 0.2  0.0 - 0.7 K/uL   Basophils Relative 0 %   Basophils Absolute 0.0 0.0 - 0.1 K/uL  Basic metabolic panel  Result Value Ref Range   Sodium 139 135 - 145 mmol/L   Potassium 3.4 (L) 3.5 - 5.1 mmol/L   Chloride 110 101 - 111 mmol/L   CO2 23 22 - 32 mmol/L   Glucose, Bld 97 65 - 99 mg/dL   BUN 12 6 - 20 mg/dL   Creatinine, Ser 3.080.63 0.44 - 1.00 mg/dL   Calcium 8.4 (L) 8.9 - 10.3 mg/dL   GFR calc non Af Amer >60 >60 mL/min   GFR calc Af Amer >60 >60 mL/min   Anion gap 6 5 - 15  D-dimer, quantitative  Result Value Ref Range   D-Dimer, Quant <0.27 0.00 - 0.50 ug/mL-FEU  Urinalysis, Routine w reflex microscopic  Result Value Ref Range   Color, Urine YELLOW YELLOW   APPearance CLEAR CLEAR   Specific Gravity, Urine 1.015 1.005 - 1.030   pH 6.0 5.0 - 8.0   Glucose, UA NEGATIVE NEGATIVE mg/dL   Hgb urine dipstick MODERATE (A) NEGATIVE   Bilirubin Urine NEGATIVE NEGATIVE   Ketones, ur NEGATIVE NEGATIVE mg/dL   Protein, ur NEGATIVE NEGATIVE mg/dL   Nitrite NEGATIVE NEGATIVE   Leukocytes, UA NEGATIVE NEGATIVE  Urine microscopic-add on  Result Value Ref Range   Squamous Epithelial / LPF 0-5 (A) NONE SEEN   WBC, UA NONE SEEN 0 - 5 WBC/hpf   RBC / HPF  6-30 0 - 5 RBC/hpf   Bacteria, UA NONE SEEN NONE SEEN   Dg Chest 2 View 07/20/2015  CLINICAL DATA:  Non productive cough and sob x 3 days. Worse today. Chest and back pain. Hx of htn and copd. Smoker. EXAM: CHEST  2 VIEW COMPARISON:  12/06/2013 FINDINGS: Cardiac silhouette is normal in size. Normal mediastinal and hilar contours. Prominent bronchovascular markings. Lungs otherwise clear. No pleural effusion or pneumothorax. Skeletal structures are unremarkable. IMPRESSION: No active cardiopulmonary disease. Electronically Signed   By: Amie Portlandavid  Ormond M.D.   On: 07/20/2015 11:32    1300:  No UTI on Udip. Pt states she "feels better" after neb.  NAD, lungs CTA bilat, no wheezing, resps easy, speaking full sentences, Sats 99% R/A. Pt states she is ready to go home now. Tx COPD exacerbation and msk pain symptomatically at this time. Dx and testing d/w pt and family.  Questions answered.  Verb understanding, agreeable to d/c home with outpt f/u.   Samuel JesterKathleen Zykerria Tanton, DO 07/25/15 1240

## 2015-07-20 NOTE — ED Notes (Signed)
Patient with no complaints at this time. Respirations even and unlabored. Skin warm/dry. Discharge instructions reviewed with patient at this time. Patient given opportunity to voice concerns/ask questions. Patient discharged at this time and left Emergency Department with steady gait.   

## 2015-07-20 NOTE — Discharge Instructions (Signed)
Take the prescriptions as directed.  Use your albuterol inhaler (2 to 4 puffs) every 4 hours for the next 7 days, then as needed for cough, wheezing, or shortness of breath.  Apply moist heat or ice to the area(s) of discomfort, for 15 minutes at a time, several times per day for the next few days.  Do not fall asleep on a heating or ice pack.  Call your regular medical doctor today to schedule a follow up appointment within the next 3 days.  Return to the Emergency Department immediately sooner if worsening.  ° °

## 2015-08-15 ENCOUNTER — Emergency Department (HOSPITAL_COMMUNITY)
Admission: EM | Admit: 2015-08-15 | Discharge: 2015-08-15 | Disposition: A | Discharge status: Home / Self Care | Payer: PRIVATE HEALTH INSURANCE | Attending: Dermatology | Admitting: Dermatology

## 2015-08-15 ENCOUNTER — Encounter (HOSPITAL_COMMUNITY): Payer: Self-pay | Admitting: Emergency Medicine

## 2015-08-15 DIAGNOSIS — Z5321 Procedure and treatment not carried out due to patient leaving prior to being seen by health care provider: Secondary | ICD-10-CM | POA: Insufficient documentation

## 2015-08-15 DIAGNOSIS — J449 Chronic obstructive pulmonary disease, unspecified: Secondary | ICD-10-CM | POA: Insufficient documentation

## 2015-08-15 DIAGNOSIS — F17210 Nicotine dependence, cigarettes, uncomplicated: Secondary | ICD-10-CM | POA: Insufficient documentation

## 2015-08-15 DIAGNOSIS — I10 Essential (primary) hypertension: Secondary | ICD-10-CM | POA: Insufficient documentation

## 2015-08-15 DIAGNOSIS — M5440 Lumbago with sciatica, unspecified side: Secondary | ICD-10-CM | POA: Insufficient documentation

## 2015-08-15 NOTE — ED Notes (Signed)
Pt states she is having sciatica. States her lower back pain started after she had to move a washer. C/o radiating pain down L. Leg.

## 2015-08-16 ENCOUNTER — Encounter (HOSPITAL_COMMUNITY): Payer: Self-pay | Admitting: Cardiology

## 2015-08-16 ENCOUNTER — Emergency Department (HOSPITAL_COMMUNITY)
Admission: EM | Admit: 2015-08-16 | Discharge: 2015-08-16 | Disposition: A | Discharge status: Home / Self Care | Payer: PRIVATE HEALTH INSURANCE | Attending: Emergency Medicine | Admitting: Emergency Medicine

## 2015-08-16 DIAGNOSIS — Z791 Long term (current) use of non-steroidal anti-inflammatories (NSAID): Secondary | ICD-10-CM | POA: Insufficient documentation

## 2015-08-16 DIAGNOSIS — Z79899 Other long term (current) drug therapy: Secondary | ICD-10-CM | POA: Insufficient documentation

## 2015-08-16 DIAGNOSIS — I10 Essential (primary) hypertension: Secondary | ICD-10-CM | POA: Insufficient documentation

## 2015-08-16 DIAGNOSIS — F17210 Nicotine dependence, cigarettes, uncomplicated: Secondary | ICD-10-CM | POA: Insufficient documentation

## 2015-08-16 DIAGNOSIS — M5432 Sciatica, left side: Secondary | ICD-10-CM

## 2015-08-16 DIAGNOSIS — J449 Chronic obstructive pulmonary disease, unspecified: Secondary | ICD-10-CM | POA: Insufficient documentation

## 2015-08-16 DIAGNOSIS — Z7982 Long term (current) use of aspirin: Secondary | ICD-10-CM | POA: Insufficient documentation

## 2015-08-16 MED ORDER — PREDNISONE 10 MG PO TABS: ORAL_TABLET | ORAL | Status: DC

## 2015-08-16 MED ORDER — METHOCARBAMOL 500 MG PO TABS: 1000.0000 mg | ORAL_TABLET | Freq: Four times a day (QID) | ORAL | Status: AC

## 2015-08-16 MED ORDER — METHOCARBAMOL 500 MG PO TABS
1000.0000 mg | ORAL_TABLET | Freq: Once | ORAL | Status: AC
  Administered 2015-08-16: 1000 mg via ORAL
  Filled 2015-08-16: qty 2

## 2015-08-16 MED ORDER — PREDNISONE 50 MG PO TABS
60.0000 mg | ORAL_TABLET | Freq: Once | ORAL | Status: AC
  Administered 2015-08-16: 60 mg via ORAL
  Filled 2015-08-16: qty 1

## 2015-08-16 NOTE — Discharge Instructions (Signed)
Hypertension Hypertension, commonly called high blood pressure, is when the force of blood pumping through your arteries is too strong. Your arteries are the blood vessels that carry blood from your heart throughout your body. A blood pressure reading consists of a higher number over a lower number, such as 110/72. The higher number (systolic) is the pressure inside your arteries when your heart pumps. The lower number (diastolic) is the pressure inside your arteries when your heart relaxes. Ideally you want your blood pressure below 120/80. Hypertension forces your heart to work harder to pump blood. Your arteries may become narrow or stiff. Having untreated or uncontrolled hypertension can cause heart attack, stroke, kidney disease, and other problems. RISK FACTORS Some risk factors for high blood pressure are controllable. Others are not.  Risk factors you cannot control include:   Race. You may be at higher risk if you are African American.  Age. Risk increases with age.  Gender. Men are at higher risk than women before age 45 years. After age 65, women are at higher risk than men. Risk factors you can control include:  Not getting enough exercise or physical activity.  Being overweight.  Getting too much fat, sugar, calories, or salt in your diet.  Drinking too much alcohol. SIGNS AND SYMPTOMS Hypertension does not usually cause signs or symptoms. Extremely high blood pressure (hypertensive crisis) may cause headache, anxiety, shortness of breath, and nosebleed. DIAGNOSIS To check if you have hypertension, your health care provider will measure your blood pressure while you are seated, with your arm held at the level of your heart. It should be measured at least twice using the same arm. Certain conditions can cause a difference in blood pressure between your right and left arms. A blood pressure reading that is higher than normal on one occasion does not mean that you need treatment. If  it is not clear whether you have high blood pressure, you may be asked to return on a different day to have your blood pressure checked again. Or, you may be asked to monitor your blood pressure at home for 1 or more weeks. TREATMENT Treating high blood pressure includes making lifestyle changes and possibly taking medicine. Living a healthy lifestyle can help lower high blood pressure. You may need to change some of your habits. Lifestyle changes may include:  Following the DASH diet. This diet is high in fruits, vegetables, and whole grains. It is low in salt, red meat, and added sugars.  Keep your sodium intake below 2,300 mg per day.  Getting at least 30-45 minutes of aerobic exercise at least 4 times per week.  Losing weight if necessary.  Not smoking.  Limiting alcoholic beverages.  Learning ways to reduce stress. Your health care provider may prescribe medicine if lifestyle changes are not enough to get your blood pressure under control, and if one of the following is true:  You are 18-59 years of age and your systolic blood pressure is above 140.  You are 60 years of age or older, and your systolic blood pressure is above 150.  Your diastolic blood pressure is above 90.  You have diabetes, and your systolic blood pressure is over 140 or your diastolic blood pressure is over 90.  You have kidney disease and your blood pressure is above 140/90.  You have heart disease and your blood pressure is above 140/90. Your personal target blood pressure may vary depending on your medical conditions, your age, and other factors. HOME CARE INSTRUCTIONS    Have your blood pressure rechecked as directed by your health care provider.   Take medicines only as directed by your health care provider. Follow the directions carefully. Blood pressure medicines must be taken as prescribed. The medicine does not work as well when you skip doses. Skipping doses also puts you at risk for  problems.  Do not smoke.   Monitor your blood pressure at home as directed by your health care provider. SEEK MEDICAL CARE IF:   You think you are having a reaction to medicines taken.  You have recurrent headaches or feel dizzy.  You have swelling in your ankles.  You have trouble with your vision. SEEK IMMEDIATE MEDICAL CARE IF:  You develop a severe headache or confusion.  You have unusual weakness, numbness, or feel faint.  You have severe chest or abdominal pain.  You vomit repeatedly.  You have trouble breathing. MAKE SURE YOU:   Understand these instructions.  Will watch your condition.  Will get help right away if you are not doing well or get worse.   This information is not intended to replace advice given to you by your health care provider. Make sure you discuss any questions you have with your health care provider.   Document Released: 01/13/2005 Document Revised: 05/30/2014 Document Reviewed: 11/05/2012 Elsevier Interactive Patient Education 2016 [employer]  Sciatica Sciatica is pain, weakness, numbness, or tingling along the path of the sciatic nerve. The nerve starts in the lower back and runs down the back of each leg. The nerve controls the muscles in the lower leg and in the back of the knee, while also providing sensation to the back of the thigh, lower leg, and the sole of your foot. Sciatica is a symptom of another medical condition. For instance, nerve damage or certain conditions, such as a herniated disk or bone spur on the spine, pinch or put pressure on the sciatic nerve. This causes the pain, weakness, or other sensations normally associated with sciatica. Generally, sciatica only affects one side of the body. CAUSES   Herniated or slipped disc.  Degenerative disk disease.  A pain disorder involving the narrow muscle in the buttocks (piriformis syndrome).  Pelvic injury or fracture.  Pregnancy.  Tumor (rare). SYMPTOMS  Symptoms  can vary from mild to very severe. The symptoms usually travel from the low back to the buttocks and down the back of the leg. Symptoms can include:  Mild tingling or dull aches in the lower back, leg, or hip.  Numbness in the back of the calf or sole of the foot.  Burning sensations in the lower back, leg, or hip.  Sharp pains in the lower back, leg, or hip.  Leg weakness.  Severe back pain inhibiting movement. These symptoms may get worse with coughing, sneezing, laughing, or prolonged sitting or standing. Also, being overweight may worsen symptoms. DIAGNOSIS  Your caregiver will perform a physical exam to look for common symptoms of sciatica. He or she may ask you to do certain movements or activities that would trigger sciatic nerve pain. Other tests may be performed to find the cause of the sciatica. These may include:  Blood tests.  X-rays.  Imaging tests, such as an MRI or CT scan. TREATMENT  Treatment is directed at the cause of the sciatic pain. Sometimes, treatment is not necessary and the pain and discomfort goes away on its own. If treatment is needed, your caregiver may suggest:  Over-the-counter medicines to relieve pain.  Prescription medicines, such  as anti-inflammatory medicine, muscle relaxants, or narcotics.  Applying heat or ice to the painful area.  Steroid injections to lessen pain, irritation, and inflammation around the nerve.  Reducing activity during periods of pain.  Exercising and stretching to strengthen your abdomen and improve flexibility of your spine. Your caregiver may suggest losing weight if the extra weight makes the back pain worse.  Physical therapy.  Surgery to eliminate what is pressing or pinching the nerve, such as a bone spur or part of a herniated disk. HOME CARE INSTRUCTIONS   Only take over-the-counter or prescription medicines for pain or discomfort as directed by your caregiver.  Apply ice to the affected area for 20  minutes, 3-4 times a day for the first 48-72 hours. Then try heat in the same way.  Exercise, stretch, or perform your usual activities if these do not aggravate your pain.  Attend physical therapy sessions as directed by your caregiver.  Keep all follow-up appointments as directed by your caregiver.  Do not wear high heels or shoes that do not provide proper support.  Check your mattress to see if it is too soft. A firm mattress may lessen your pain and discomfort. SEEK IMMEDIATE MEDICAL CARE IF:   You lose control of your bowel or bladder (incontinence).  You have increasing weakness in the lower back, pelvis, buttocks, or legs.  You have redness or swelling of your back.  You have a burning sensation when you urinate.  You have pain that gets worse when you lie down or awakens you at night.  Your pain is worse than you have experienced in the past.  Your pain is lasting longer than 4 weeks.  You are suddenly losing weight without reason. MAKE SURE YOU:  Understand these instructions.  Will watch your condition.  Will get help right away if you are not doing well or get worse.   This information is not intended to replace advice given to you by your health care provider. Make sure you discuss any questions you have with your health care provider.   Document Released: 01/07/2001 Document Revised: 10/04/2014 Document Reviewed: 05/25/2011 Elsevier Interactive Patient Education 2016 [employer]   Take your next dose of prednisone tomorrow morning.  Use the the other medicines as directed.  Do not drive within 4 hours of taking robaxin as this will make you drowsy.  Avoid lifting,  Bending,  Twisting or any other activity that worsens your pain over the next week.  Apply an  icepack  to your lower back for 10-15 minutes every 2 hours for the next 2 days.  You should get rechecked if your symptoms are not better over the next 5 days,  Or you develop increased pain,  Weakness  in your leg(s) or loss of bladder or bowel function - these are symptoms of a worse injury.  I suggest taking 1.5 tablets of your blood pressure medicine daily and following up with your doctor for a recheck of your blood pressure and your back.  I recommend getting a pain patch called Salon Pas (no prescription needed) which you can apply directly to your back where it hurts the most.  This can be very helpful.

## 2015-08-16 NOTE — ED Notes (Signed)
Lower back pain that radiates into left leg since yesterday.  Injured while moving washing machine.

## 2015-08-16 NOTE — ED Provider Notes (Signed)
CSN: [id number]     Arrival date & time 08/16/15  4098 History   First MD Initiated Contact with Patient 08/16/15 [phone]     Chief Complaint  Patient presents with  . Back Pain     (Consider location/radiation/quality/duration/timing/severity/associated sxs/prior Treatment) The history is provided by the patient.   [name] is a 50 y.o. female  presenting with acute on chronic low back pain which has which flared up yesterday when she moved her washing machine to remove a snack that was behind it.  There is radiation of pain into the left lower extremity to her heel which is typical for her sciatica.  There has been no weakness or numbness in the lower extremities and no urinary or bowel retention or incontinence.  Patient does not have a history of cancer or IVDU.  The patient has tried back formula Goody powders without significant relief of symptoms.  We also discussed her elevated bp.  She states she sometimes takes 1.5 of her lisinopril as she doesn't feel the 10 mg tablets help her anymore.  She tried taking 2 tablets and her bp got too low.  She has not discussed this with her pcp.  Denies headache, chest pain, dizziness, peripheral edema, sob.      Past Medical History  Diagnosis Date  . COPD (chronic obstructive pulmonary disease) (HCC)   . Anemia   . Pleurisy   . GERD (gastroesophageal reflux disease)   . Hypertension   . Sciatica    Past Surgical History  Procedure Laterality Date  . Tubal ligation    . Cyst removed from right face    . Esophagogastroduodenoscopy  11/07/2010    Procedure: ESOPHAGOGASTRODUODENOSCOPY (EGD);  Surgeon: Corbin Ade, MD;  Location: [facility];  Service: Endoscopy;  Laterality: N/A;  . Dilitation & currettage/hystroscopy with thermachoice ablation N/A 11/17/2012    Procedure: DILATATION & CURETTAGE/HYSTEROSCOPY WITH THERMACHOICE ABLATION;  Surgeon: Lazaro Arms, MD;  Location: [facility];  Service: Gynecology;  Laterality: N/A;  Total  D5 in 21ml, total D5 out 21ml; temp 86-87 degrees Celcius; total time 9 minutes and 36 seconds;   . Ablation     Family History  Problem Relation Age of Onset  . Colon cancer Father 47    deceased secondary to kidney failure  . Diabetes Father   . Colon cancer Brother 26    recent diagnosis (2012). Finishing chemotherapy.  . Breast cancer Maternal Aunt   . Diabetes Mother   . Diabetes Brother   . Hypertension Brother   . Diabetes Maternal Grandmother    Social History  Substance Use Topics  . Smoking status: Current Every Day Smoker -- 0.25 packs/day for 20 years    Types: Cigarettes  . Smokeless tobacco: Never Used  . Alcohol Use: No   OB History    Gravida Para Term Preterm AB TAB SAB Ectopic Multiple Living   2 2 2       2      Review of Systems  Constitutional: Negative for fever.  Respiratory: Negative for shortness of breath.   Cardiovascular: Negative for chest pain and leg swelling.  Gastrointestinal: Negative for abdominal pain, constipation and abdominal distention.  Genitourinary: Negative for dysuria, urgency, frequency, flank pain and difficulty urinating.  Musculoskeletal: Positive for back pain. Negative for joint swelling and gait problem.  Skin: Negative for rash.  Neurological: Negative for weakness and numbness.      Allergies  Tramadol and Tylenol  Home Medications  Prior to Admission medications   Medication Sig Start Date End Date Taking? Authorizing Provider  albuterol (PROVENTIL HFA;VENTOLIN HFA) 108 (90 Base) MCG/ACT inhaler Inhale 2 puffs into the lungs every 4 (four) hours as needed for wheezing or shortness of breath. 07/20/15   Samuel JesterKathleen McManus, DO  Aspirin-Caffeine (BC FAST PAIN RELIEF PO) Take 1 packet by mouth daily as needed (pain).    Historical Provider, MD  azithromycin (ZITHROMAX) 250 MG tablet Take 2 tablets PO day 1, then 1 tab PO daily x4 days. 07/20/15   Samuel JesterKathleen McManus, DO  cyclobenzaprine (FLEXERIL) 5 MG tablet Take 1 tablet  (5 mg total) by mouth 3 (three) times daily as needed for muscle spasms. Patient not taking: Reported on 07/20/2015 06/24/15   Burgess AmorJulie Kathy Wares, PA-C  ibuprofen (ADVIL,MOTRIN) 600 MG tablet Take 1 tablet (600 mg total) by mouth every 6 (six) hours as needed. Patient not taking: Reported on 07/20/2015 06/24/15   Burgess AmorJulie Lysle Yero, PA-C  lisinopril (PRINIVIL,ZESTRIL) 10 MG tablet Take 10 mg by mouth daily.    Historical Provider, MD  methocarbamol (ROBAXIN) 500 MG tablet Take 2 tablets (1,000 mg total) by mouth 4 (four) times daily. 08/16/15 08/26/15  Burgess AmorJulie Keierra Nudo, PA-C  omeprazole (PRILOSEC OTC) 20 MG tablet Take 20 mg by mouth daily.    Historical Provider, MD  oxyCODONE (ROXICODONE) 5 MG immediate release tablet Take 1 tablet (5 mg total) by mouth every 4 (four) hours as needed for severe pain. Patient not taking: Reported on 07/20/2015 06/24/15   Burgess AmorJulie Richanda Darin, PA-C  predniSONE (DELTASONE) 10 MG tablet 6, 5, 4, 3, 2 then 1 tablet by mouth daily for 6 days total. 08/16/15   Burgess AmorJulie Ivie Savitt, PA-C   BP 166/100 mmHg  Pulse 77  Temp(Src) 97.7 F (36.5 C) (Oral)  Resp 18  Ht 5\' 3"  (1.6 m)  Wt 65.772 kg  BMI 25.69 kg/m2  SpO2 99% Physical Exam  Constitutional: She appears well-developed and well-nourished.  HENT:  Head: Normocephalic.  Eyes: Conjunctivae are normal.  Neck: Normal range of motion. Neck supple.  Cardiovascular: Normal rate and intact distal pulses.   Pedal pulses normal.  Pulmonary/Chest: Effort normal.  Abdominal: Soft. Bowel sounds are normal. She exhibits no distension, no abdominal bruit, no pulsatile midline mass and no mass.  Musculoskeletal: Normal range of motion. She exhibits tenderness. She exhibits no edema.       Lumbar back: She exhibits tenderness. She exhibits no swelling, no edema and no spasm.  Bilateral parlumbar ttp.  Neurological: She is alert. She has normal strength. She displays no atrophy and no tremor. No sensory deficit. Gait normal.  Reflex Scores:      Patellar reflexes are  2+ on the right side and 2+ on the left side.      Achilles reflexes are 2+ on the right side and 2+ on the left side. No strength deficit noted in hip and knee flexor and extensor muscle groups.  Ankle flexion and extension intact.  Skin: Skin is warm and dry.  Psychiatric: She has a normal mood and affect.  Nursing note and vitals reviewed.   ED Course  Procedures (including critical care time) Labs Review Labs Reviewed - No data to display  Imaging Review No results found. I have personally reviewed and evaluated these images and lab results as part of my medical decision-making.   EKG Interpretation None      MDM   Final diagnoses:  Sciatica of left side  Essential hypertension   No neuro deficit  on exam or by history to suggest emergent or surgical presentation.  Also discussed worsened sx that should prompt immediate re-evaluation including distal weakness, bowel/bladder retention/incontinence.   Prednisone taper, robaxin, Salon Pas pain patch.  Ice x 2 days,  Adding heat on day 3.  Prn f/u with pcp.  Advised to increase lisinopril to 1.5 tabs daily. F/u with pcp.      Burgess Amor, PA-C 08/16/15 0848  Mancel Bale, MD 08/16/15 [phone]

## 2015-08-29 ENCOUNTER — Emergency Department (HOSPITAL_COMMUNITY)
Admission: EM | Admit: 2015-08-29 | Discharge: 2015-08-29 | Disposition: A | Discharge status: Home / Self Care | Payer: Self-pay | Attending: Emergency Medicine | Admitting: Emergency Medicine

## 2015-08-29 ENCOUNTER — Encounter (HOSPITAL_COMMUNITY): Payer: Self-pay | Admitting: Emergency Medicine

## 2015-08-29 ENCOUNTER — Emergency Department (HOSPITAL_COMMUNITY): Payer: Self-pay

## 2015-08-29 DIAGNOSIS — Z87828 Personal history of other (healed) physical injury and trauma: Secondary | ICD-10-CM

## 2015-08-29 DIAGNOSIS — J449 Chronic obstructive pulmonary disease, unspecified: Secondary | ICD-10-CM | POA: Insufficient documentation

## 2015-08-29 DIAGNOSIS — Z79899 Other long term (current) drug therapy: Secondary | ICD-10-CM | POA: Insufficient documentation

## 2015-08-29 DIAGNOSIS — M79672 Pain in left foot: Secondary | ICD-10-CM | POA: Insufficient documentation

## 2015-08-29 DIAGNOSIS — F17210 Nicotine dependence, cigarettes, uncomplicated: Secondary | ICD-10-CM | POA: Insufficient documentation

## 2015-08-29 DIAGNOSIS — Z791 Long term (current) use of non-steroidal anti-inflammatories (NSAID): Secondary | ICD-10-CM | POA: Insufficient documentation

## 2015-08-29 DIAGNOSIS — I10 Essential (primary) hypertension: Secondary | ICD-10-CM | POA: Insufficient documentation

## 2015-08-29 DIAGNOSIS — Z7982 Long term (current) use of aspirin: Secondary | ICD-10-CM | POA: Insufficient documentation

## 2015-08-29 MED ORDER — IBUPROFEN 800 MG PO TABS
800.0000 mg | ORAL_TABLET | Freq: Once | ORAL | Status: AC
  Administered 2015-08-29: 800 mg via ORAL
  Filled 2015-08-29: qty 1

## 2015-08-29 MED ORDER — IBUPROFEN 800 MG PO TABS: 800.0000 mg | ORAL_TABLET | Freq: Three times a day (TID) | ORAL | 0 refills | Status: DC | PRN

## 2015-08-29 NOTE — Discharge Instructions (Signed)
Keep foot elevated and follow-up with your doctor next week

## 2015-08-29 NOTE — ED Provider Notes (Signed)
[facility] Provider Note   CSN: [id number] Arrival date & time: 08/29/15  2102  First Provider Contact:  First MD Initiated Contact with Patient 08/29/15 2146    By signing my name below, I, Levon Hedger, attest that this documentation has been prepared under the direction and in the presence of Bethann Berkshire, MD . Electronically Signed: Levon Hedger, Scribe. 08/29/2015. 9:52 PM.   History   Chief Complaint Chief Complaint  Patient presents with  . Foot Pain    HPI [name] is a 50 y.o. female who presents to the Emergency Department complaining of sudden onset, moderate pain to left foot and ankle which began approximately two hours ago. Pt states she stepped in a large hole and twisted her ankle. No alleviating or modifying factors noted. No other complaints at this time.    The history is provided by the patient. No language interpreter was used.  Foot Pain  This is a new problem. The current episode started 1 to 2 hours ago. The problem occurs constantly. The problem has not changed since onset.Pertinent negatives include no chest pain, no abdominal pain and no headaches. Exacerbated by: Walking. Nothing relieves the symptoms. She has tried nothing for the symptoms.      Past Medical History:  Diagnosis Date  . Anemia   . COPD (chronic obstructive pulmonary disease) (HCC)   . GERD (gastroesophageal reflux disease)   . Hypertension   . Pleurisy   . Sciatica     Patient Active Problem List   Diagnosis Date Noted  . Anemia 07/06/2012  . Menorrhagia with irregular cycle 07/06/2012  . Abnormal uterine bleeding 11/08/2010  . HTN (hypertension) 11/08/2010  . Iron deficiency 11/08/2010  . Folate deficiency 11/08/2010  . Melena 11/07/2010  . Anemia, unspecified 11/06/2010  . Tobacco abuse 11/06/2010    Past Surgical History:  Procedure Laterality Date  . ABLATION    . cyst removed from right face    . DILITATION & CURRETTAGE/HYSTROSCOPY WITH  THERMACHOICE ABLATION N/A 11/17/2012   Procedure: DILATATION & CURETTAGE/HYSTEROSCOPY WITH THERMACHOICE ABLATION;  Surgeon: Lazaro Arms, MD;  Location: [facility];  Service: Gynecology;  Laterality: N/A;  Total D5 in 12ml, total D5 out 33ml; temp 86-87 degrees Celcius; total time 9 minutes and 36 seconds;   . ESOPHAGOGASTRODUODENOSCOPY  11/07/2010   Procedure: ESOPHAGOGASTRODUODENOSCOPY (EGD);  Surgeon: Corbin Ade, MD;  Location: [facility];  Service: Endoscopy;  Laterality: N/A;  . TUBAL LIGATION      OB History    Gravida Para Term Preterm AB Living   2 2 2     2    SAB TAB Ectopic Multiple Live Births                   Home Medications    Prior to Admission medications   Medication Sig Start Date End Date Taking? Authorizing Provider  albuterol (PROVENTIL HFA;VENTOLIN HFA) 108 (90 Base) MCG/ACT inhaler Inhale 2 puffs into the lungs every 4 (four) hours as needed for wheezing or shortness of breath. 07/20/15   Samuel Jester, DO  Aspirin-Caffeine (BC FAST PAIN RELIEF PO) Take 1 packet by mouth daily as needed (pain).    Historical Provider, MD  azithromycin (ZITHROMAX) 250 MG tablet Take 2 tablets PO day 1, then 1 tab PO daily x4 days. 07/20/15   Samuel Jester, DO  cyclobenzaprine (FLEXERIL) 5 MG tablet Take 1 tablet (5 mg total) by mouth 3 (three) times daily as needed for muscle spasms.  Patient not taking: Reported on 07/20/2015 06/24/15   Burgess Amor, PA-C  ibuprofen (ADVIL,MOTRIN) 600 MG tablet Take 1 tablet (600 mg total) by mouth every 6 (six) hours as needed. Patient not taking: Reported on 07/20/2015 06/24/15   Burgess Amor, PA-C  lisinopril (PRINIVIL,ZESTRIL) 10 MG tablet Take 10 mg by mouth daily.    Historical Provider, MD  omeprazole (PRILOSEC OTC) 20 MG tablet Take 20 mg by mouth daily.    Historical Provider, MD  oxyCODONE (ROXICODONE) 5 MG immediate release tablet Take 1 tablet (5 mg total) by mouth every 4 (four) hours as needed for severe pain. Patient not taking:  Reported on 07/20/2015 06/24/15   Burgess Amor, PA-C  predniSONE (DELTASONE) 10 MG tablet 6, 5, 4, 3, 2 then 1 tablet by mouth daily for 6 days total. 08/16/15   Burgess Amor, PA-C    Family History Family History  Problem Relation Age of Onset  . Colon cancer Father 40    deceased secondary to kidney failure  . Diabetes Father   . Colon cancer Brother 103    recent diagnosis (2012). Finishing chemotherapy.  . Breast cancer Maternal Aunt   . Diabetes Mother   . Diabetes Brother   . Hypertension Brother   . Diabetes Maternal Grandmother     Social History Social History  Substance Use Topics  . Smoking status: Current Every Day Smoker    Packs/day: 0.25    Years: 20.00    Types: Cigarettes  . Smokeless tobacco: Never Used  . Alcohol use No     Allergies   Tramadol and Tylenol [acetaminophen]   Review of Systems Review of Systems  Constitutional: Negative for appetite change and fatigue.  HENT: Negative for congestion, ear discharge and sinus pressure.   Eyes: Negative for discharge.  Respiratory: Negative for cough.   Cardiovascular: Negative for chest pain.  Gastrointestinal: Negative for abdominal pain and diarrhea.  Genitourinary: Negative for frequency and hematuria.  Musculoskeletal: Positive for arthralgias and myalgias. Negative for back pain.  Skin: Negative for rash.  Neurological: Negative for seizures and headaches.  Psychiatric/Behavioral: Negative for hallucinations.     Physical Exam Updated Vital Signs BP (!) 174/105 (BP Location: Left Arm)   Pulse 77   Temp 98.3 F (36.8 C) (Oral)   Resp 20   Ht 5\' 3"  (1.6 m)   Wt 145 lb (65.8 kg)   SpO2 98%   BMI 25.69 kg/m   Physical Exam  Constitutional: She is oriented to person, place, and time. She appears well-developed.  HENT:  Head: Normocephalic.  Eyes: Conjunctivae are normal.  Neck: No tracheal deviation present.  Cardiovascular:  No murmur heard. Musculoskeletal: Normal range of motion. She  exhibits tenderness.  Tenderness to achilles tendon Tenderness to bottom of foot more towards the toes  Neurological: She is oriented to person, place, and time.  Skin: Skin is warm.  Psychiatric: She has a normal mood and affect.     ED Treatments / Results  DIAGNOSTIC STUDIES:  Oxygen Saturation is 98% on RA, normal by my interpretation.    COORDINATION OF CARE:  9:52 PM Discussed treatment plan with pt at bedside and pt agreed to plan.  Labs (all labs ordered are listed, but only abnormal results are displayed) Labs Reviewed - No data to display  EKG  EKG Interpretation None       Radiology Dg Ankle Complete Left  Result Date: 08/29/2015 CLINICAL DATA:  50 year old female with left ankle twisting. EXAM: LEFT FOOT -  COMPLETE 3+ VIEW; LEFT ANKLE COMPLETE - 3+ VIEW COMPARISON:  None. FINDINGS: There is no acute fracture or dislocation. The bones are well mineralized. The ankle mortise is intact. The soft tissues appear unremarkable. IMPRESSION: Negative. Electronically Signed   By: Elgie Collard M.D.   On: 08/29/2015 21:44   Dg Foot Complete Left  Result Date: 08/29/2015 CLINICAL DATA:  50 year old female with left ankle twisting. EXAM: LEFT FOOT - COMPLETE 3+ VIEW; LEFT ANKLE COMPLETE - 3+ VIEW COMPARISON:  None. FINDINGS: There is no acute fracture or dislocation. The bones are well mineralized. The ankle mortise is intact. The soft tissues appear unremarkable. IMPRESSION: Negative. Electronically Signed   By: Elgie Collard M.D.   On: 08/29/2015 21:44    Procedures Procedures (including critical care time)  Medications Ordered in ED Medications - No data to display   Initial Impression / Assessment and Plan / ED Course  I have reviewed the triage vital signs and the nursing notes.  Pertinent labs & imaging results that were available during my care of the patient were reviewed by me and considered in my medical decision making (see chart for  details).  Clinical Course    Patient with sprain foot. She will be given crutches Motrin and Ace wrap will follow-up with her PCP  Final Clinical Impressions(s) / ED Diagnoses   Final diagnoses:  None  The chart was scribed for me under my direct supervision.  I personally performed the history, physical, and medical decision making and all procedures in the evaluation of this patient..   New Prescriptions New Prescriptions   No medications on file     Bethann Berkshire, MD 08/29/15 2252

## 2015-08-29 NOTE — ED Notes (Signed)
Pt returned from xray,  

## 2015-08-29 NOTE — ED Notes (Signed)
Pt twisted left foot and ankle after stepping in a hole, cms intact distal, c/o pain to foot and ankle,

## 2015-08-29 NOTE — ED Triage Notes (Signed)
Pt c/o left foot pain after twisting foot in whole about one hour ago.

## 2015-08-29 NOTE — ED Notes (Signed)
MD at bedside. 

## 2015-08-29 NOTE — ED Notes (Signed)
Patient transported to X-ray 

## 2016-06-15 ENCOUNTER — Encounter (HOSPITAL_COMMUNITY): Payer: Self-pay | Admitting: Emergency Medicine

## 2016-06-15 ENCOUNTER — Emergency Department (HOSPITAL_COMMUNITY)
Admission: EM | Admit: 2016-06-15 | Discharge: 2016-06-15 | Disposition: A | Discharge status: Home / Self Care | Payer: PRIVATE HEALTH INSURANCE | Attending: Emergency Medicine | Admitting: Emergency Medicine

## 2016-06-15 DIAGNOSIS — I10 Essential (primary) hypertension: Secondary | ICD-10-CM | POA: Insufficient documentation

## 2016-06-15 DIAGNOSIS — Y939 Activity, unspecified: Secondary | ICD-10-CM | POA: Insufficient documentation

## 2016-06-15 DIAGNOSIS — S39012A Strain of muscle, fascia and tendon of lower back, initial encounter: Secondary | ICD-10-CM | POA: Insufficient documentation

## 2016-06-15 DIAGNOSIS — Z79899 Other long term (current) drug therapy: Secondary | ICD-10-CM | POA: Insufficient documentation

## 2016-06-15 DIAGNOSIS — X58XXXA Exposure to other specified factors, initial encounter: Secondary | ICD-10-CM | POA: Insufficient documentation

## 2016-06-15 DIAGNOSIS — Y929 Unspecified place or not applicable: Secondary | ICD-10-CM | POA: Insufficient documentation

## 2016-06-15 DIAGNOSIS — J449 Chronic obstructive pulmonary disease, unspecified: Secondary | ICD-10-CM | POA: Insufficient documentation

## 2016-06-15 DIAGNOSIS — Y999 Unspecified external cause status: Secondary | ICD-10-CM | POA: Insufficient documentation

## 2016-06-15 DIAGNOSIS — F17210 Nicotine dependence, cigarettes, uncomplicated: Secondary | ICD-10-CM | POA: Insufficient documentation

## 2016-06-15 DIAGNOSIS — M5136 Other intervertebral disc degeneration, lumbar region: Secondary | ICD-10-CM | POA: Insufficient documentation

## 2016-06-15 DIAGNOSIS — M5440 Lumbago with sciatica, unspecified side: Secondary | ICD-10-CM

## 2016-06-15 DIAGNOSIS — Z7982 Long term (current) use of aspirin: Secondary | ICD-10-CM | POA: Insufficient documentation

## 2016-06-15 MED ORDER — ONDANSETRON HCL 4 MG PO TABS
4.0000 mg | ORAL_TABLET | Freq: Once | ORAL | Status: AC
  Administered 2016-06-15: 4 mg via ORAL
  Filled 2016-06-15: qty 1

## 2016-06-15 MED ORDER — DEXAMETHASONE 4 MG PO TABS: 4.0000 mg | ORAL_TABLET | Freq: Two times a day (BID) | ORAL | 0 refills | Status: AC

## 2016-06-15 MED ORDER — DIAZEPAM 5 MG PO TABS
10.0000 mg | ORAL_TABLET | Freq: Once | ORAL | Status: AC
  Administered 2016-06-15: 10 mg via ORAL
  Filled 2016-06-15: qty 2

## 2016-06-15 MED ORDER — DICLOFENAC SODIUM 75 MG PO TBEC: 75.0000 mg | DELAYED_RELEASE_TABLET | Freq: Two times a day (BID) | ORAL | 0 refills | Status: AC

## 2016-06-15 MED ORDER — KETOROLAC TROMETHAMINE 10 MG PO TABS
10.0000 mg | ORAL_TABLET | Freq: Once | ORAL | Status: AC
  Administered 2016-06-15: 10 mg via ORAL
  Filled 2016-06-15: qty 1

## 2016-06-15 MED ORDER — CYCLOBENZAPRINE HCL 10 MG PO TABS: 10.0000 mg | ORAL_TABLET | Freq: Three times a day (TID) | ORAL | 0 refills | Status: AC

## 2016-06-15 NOTE — Discharge Instructions (Signed)
Review of your previous x-rays reveals degenerative disc disease of several areas of your lower back. There is also arthritis changes of your lower back. Your examination suggests spasm of your back probably related to your activities recently. Please use Flexeril 3 times daily, Decadron 2 times daily, and diclofenac 2 times daily. Flexeril may cause drowsiness, please do not drive, drink alcohol, operate machinery, or participate in activities requiring concentration when taking this particular medication. Please see Dr. Romeo AppleHarrison, or the orthopedic specialist of your choice concerning the multiple changes in your back.

## 2016-06-15 NOTE — ED Triage Notes (Signed)
Pt reports low back pain going down both legs.  Denies injury/neuro changes.  Took bp meds this morning.

## 2016-06-15 NOTE — ED Notes (Signed)
Pt reports hx of sciatica- pain to lower back started yesterday, into her legs bilaterally and was initially relieved by ibuprofen-   She ambulates heel to toe without any change in fluidity of movement, denies incontinence, and also reports a headache- she is non photophobic, conversant and has relaxed facial features.  Her healthcare is followed at the health department

## 2016-06-15 NOTE — ED Provider Notes (Signed)
[facility] Provider Note   CSN: [id number] Arrival date & time: 06/15/16  1019  By signing my name below, I, Deland Pretty, attest that this documentation has been prepared under the direction and in the presence of Ivery Quale, [place] Electronically Signed: Deland Pretty, ED Scribe. 06/15/16. 11:30 AM.  History   Chief Complaint Chief Complaint  Patient presents with  . Back Pain   The history is provided by the patient. No language interpreter was used.  Back Pain   This is a new problem. The current episode started yesterday. The problem occurs constantly. The problem has not changed since onset.The pain is associated with no known injury. The pain is present in the lumbar spine. The quality of the pain is described as burning. The pain radiates to the left thigh and right thigh. The pain is moderate. The symptoms are aggravated by certain positions. Pertinent negatives include no fever. She has tried NSAIDs for the symptoms. The treatment provided no relief.   HPI Comments: [name] is a 51 y.o. female, with a PMHx of DDD throughout spine and HTN, who presents to the Emergency Department complaining of a moderate persistent of back pain that began yesterday as she was preparing to move. She describes her pain as "burning" and reports that it radiates to R and L upper thighs and hips, and to the R and L shoulders. She reports that she took ibuprofen and tylenol with inadequate relief. The pt also attests that she took her HTN medications today. The pt denies heavy lifting, although she was performing repetitive movements.  Past Medical History:  Diagnosis Date  . Anemia   . COPD (chronic obstructive pulmonary disease) (HCC)   . GERD (gastroesophageal reflux disease)   . Hypertension   . Pleurisy   . Sciatica     Patient Active Problem List   Diagnosis Date Noted  . Anemia 07/06/2012  . Menorrhagia with irregular cycle 07/06/2012  . Abnormal uterine bleeding  11/08/2010  . HTN (hypertension) 11/08/2010  . Iron deficiency 11/08/2010  . Folate deficiency 11/08/2010  . Melena 11/07/2010  . Anemia, unspecified 11/06/2010  . Tobacco abuse 11/06/2010    Past Surgical History:  Procedure Laterality Date  . ABLATION    . cyst removed from right face    . DILITATION & CURRETTAGE/HYSTROSCOPY WITH THERMACHOICE ABLATION N/A 11/17/2012   Procedure: DILATATION & CURETTAGE/HYSTEROSCOPY WITH THERMACHOICE ABLATION;  Surgeon: Lazaro Arms, MD;  Location: [facility];  Service: Gynecology;  Laterality: N/A;  Total D5 in 21ml, total D5 out 21ml; temp 86-87 degrees Celcius; total time 9 minutes and 36 seconds;   . ESOPHAGOGASTRODUODENOSCOPY  11/07/2010   Procedure: ESOPHAGOGASTRODUODENOSCOPY (EGD);  Surgeon: Corbin Ade, MD;  Location: [facility];  Service: Endoscopy;  Laterality: N/A;  . TUBAL LIGATION      OB History    Gravida Para Term Preterm AB Living   2 2 2     2    SAB TAB Ectopic Multiple Live Births                   Home Medications    Prior to Admission medications   Medication Sig Start Date End Date Taking? Authorizing Provider  albuterol (PROVENTIL HFA;VENTOLIN HFA) 108 (90 Base) MCG/ACT inhaler Inhale 2 puffs into the lungs every 4 (four) hours as needed for wheezing or shortness of breath. 07/20/15  Yes Samuel Jester, DO  Aspirin-Caffeine (BC FAST PAIN RELIEF PO) Take 1 packet by mouth daily  as needed (pain).   Yes [provider]  lisinopril (PRINIVIL,ZESTRIL) 20 MG tablet Take 20 mg by mouth daily.   Yes [provider]  omeprazole (PRILOSEC OTC) 20 MG tablet Take 20 mg by mouth daily as needed (acid reflux).    Yes [provider]    Family History Family History  Problem Relation Age of Onset  . Colon cancer Father 25       deceased secondary to kidney failure  . Diabetes Father   . Colon cancer Brother 109       recent diagnosis (2012). Finishing chemotherapy.  . Breast cancer Maternal Aunt     . Diabetes Mother   . Diabetes Brother   . Hypertension Brother   . Diabetes Maternal Grandmother     Social History Social History  Substance Use Topics  . Smoking status: Current Every Day Smoker    Packs/day: 1.00    Years: 20.00    Types: Cigarettes  . Smokeless tobacco: Never Used  . Alcohol use No     Allergies   Tramadol and Tylenol [acetaminophen]   Review of Systems Review of Systems  Constitutional: Negative for fever.  Musculoskeletal: Positive for arthralgias and back pain.  Skin: Negative for color change.  All other systems reviewed and are negative.    Physical Exam Updated Vital Signs BP (!) 181/101 (BP Location: Right Arm)   Pulse 77   Temp 98.1 F (36.7 C) (Oral)   Resp 18   Ht 5\' 3"  (1.6 m)   Wt 145 lb (65.8 kg)   SpO2 100%   BMI 25.69 kg/m   Physical Exam  Constitutional: She is oriented to person, place, and time. She appears well-developed and well-nourished.  HENT:  Head: Normocephalic.  Eyes: EOM are normal.  Neck: Normal range of motion.  Cardiovascular: Normal rate, regular rhythm and normal heart sounds.   Pulmonary/Chest: Effort normal.  Abdominal: She exhibits no distension.  Musculoskeletal: Normal range of motion.  Neurological: She is alert and oriented to person, place, and time.  +No gross motor or sensory observed +Gait is intact there is no foot drop.  Psychiatric: She has a normal mood and affect.  Nursing note and vitals reviewed.    ED Treatments / Results   DIAGNOSTIC STUDIES: Oxygen Saturation is 100% on RA, normal by my interpretation.   COORDINATION OF CARE: 11:18 AM-Discussed next steps with pt. Pt verbalized understanding and is agreeable with the plan.    Labs (all labs ordered are listed, but only abnormal results are displayed) Labs Reviewed - No data to display  EKG  EKG Interpretation None       Radiology No results found.  Procedures Procedures (including critical care  time)  Medications Ordered in ED Medications - No data to display   Initial Impression / Assessment and Plan / ED Course  I have reviewed the triage vital signs and the nursing notes.  Pertinent labs & imaging results that were available during my care of the patient were reviewed by me and considered in my medical decision making (see chart for details).      Final Clinical Impressions(s) / ED Diagnoses  MDM I reviewed previous records. Pt has Degenerative Disc Disease and arthritis changes dating back to 2017. I expect that this is an exacerbation of existing degenerative changes. Pt has spasm of lower back with change in position. Pt will be treated for spasm pan and referred to orthopedics.   Final diagnoses:  Strain  of lumbar region, initial encounter  Bilateral low back pain with sciatica, sciatica laterality unspecified, unspecified chronicity  DDD (degenerative disc disease), lumbar    New Prescriptions New Prescriptions   CYCLOBENZAPRINE (FLEXERIL) 10 MG TABLET    Take 1 tablet (10 mg total) by mouth 3 (three) times daily.   DEXAMETHASONE (DECADRON) 4 MG TABLET    Take 1 tablet (4 mg total) by mouth 2 (two) times daily with a meal.   DICLOFENAC (VOLTAREN) 75 MG EC TABLET    Take 1 tablet (75 mg total) by mouth 2 (two) times daily.   **I personally performed the services described in this documentation, which was scribed in my presence. The recorded information has been reviewed and is accurate.Ivery Quale*     Manpreet Strey, PA-C 06/15/16 1147    Vanetta MuldersZackowski, Scott, MD 06/15/16 1517
